# Patient Record
Sex: Female | Born: 1940 | Race: Black or African American | Hispanic: No | Marital: Married | Smoking: Never smoker
Health system: Southern US, Community
[De-identification: ages and names within clinical notes are randomized; demographics above are authoritative.]

## PROBLEM LIST (undated history)

## (undated) DIAGNOSIS — I2699 Other pulmonary embolism without acute cor pulmonale: Secondary | ICD-10-CM

## (undated) DIAGNOSIS — I10 Essential (primary) hypertension: Secondary | ICD-10-CM

## (undated) DIAGNOSIS — K219 Gastro-esophageal reflux disease without esophagitis: Secondary | ICD-10-CM

## (undated) DIAGNOSIS — I82409 Acute embolism and thrombosis of unspecified deep veins of unspecified lower extremity: Secondary | ICD-10-CM

## (undated) HISTORY — PX: ABDOMINAL HYSTERECTOMY: SHX81

## (undated) HISTORY — PX: KNEE ARTHROSCOPY: SUR90

## (undated) HISTORY — PX: KNEE SURGERY: SHX244

## (undated) HISTORY — PX: CHOLECYSTECTOMY: SHX55

---

## 2004-03-03 ENCOUNTER — Ambulatory Visit: Payer: Self-pay | Admitting: Internal Medicine

## 2004-07-14 ENCOUNTER — Ambulatory Visit: Payer: Self-pay | Admitting: Internal Medicine

## 2004-10-14 ENCOUNTER — Ambulatory Visit: Payer: Self-pay | Admitting: Internal Medicine

## 2004-10-15 ENCOUNTER — Emergency Department: Payer: Self-pay | Admitting: Emergency Medicine

## 2005-07-20 ENCOUNTER — Ambulatory Visit: Payer: Self-pay | Admitting: Internal Medicine

## 2006-02-08 ENCOUNTER — Ambulatory Visit: Payer: Self-pay | Admitting: Internal Medicine

## 2006-06-22 ENCOUNTER — Ambulatory Visit: Payer: Self-pay | Admitting: Gastroenterology

## 2006-07-26 ENCOUNTER — Ambulatory Visit: Payer: Self-pay | Admitting: Internal Medicine

## 2006-08-17 ENCOUNTER — Ambulatory Visit: Payer: Self-pay | Admitting: Orthopedic Surgery

## 2006-12-08 ENCOUNTER — Ambulatory Visit: Payer: Self-pay

## 2006-12-27 ENCOUNTER — Inpatient Hospital Stay (HOSPITAL_COMMUNITY): Admission: RE | Admit: 2006-12-27 | Discharge: 2006-12-31 | Payer: Self-pay | Admitting: Orthopedic Surgery

## 2006-12-30 ENCOUNTER — Ambulatory Visit: Payer: Self-pay | Admitting: Physical Medicine & Rehabilitation

## 2007-08-01 ENCOUNTER — Ambulatory Visit: Payer: Self-pay | Admitting: Internal Medicine

## 2007-10-03 ENCOUNTER — Ambulatory Visit: Payer: Self-pay

## 2007-10-05 ENCOUNTER — Inpatient Hospital Stay (HOSPITAL_COMMUNITY): Admission: RE | Admit: 2007-10-05 | Discharge: 2007-10-09 | Payer: Self-pay | Admitting: Orthopedic Surgery

## 2008-06-12 ENCOUNTER — Ambulatory Visit: Payer: Self-pay | Admitting: Urology

## 2008-07-10 ENCOUNTER — Ambulatory Visit: Payer: Self-pay | Admitting: Urology

## 2008-07-24 ENCOUNTER — Ambulatory Visit: Payer: Self-pay | Admitting: Urology

## 2008-08-23 ENCOUNTER — Ambulatory Visit: Payer: Self-pay | Admitting: Internal Medicine

## 2009-02-15 ENCOUNTER — Ambulatory Visit: Payer: Self-pay | Admitting: Urology

## 2009-09-09 ENCOUNTER — Ambulatory Visit: Payer: Self-pay | Admitting: Internal Medicine

## 2009-09-11 ENCOUNTER — Ambulatory Visit: Payer: Self-pay | Admitting: Internal Medicine

## 2010-02-18 ENCOUNTER — Ambulatory Visit: Payer: Self-pay | Admitting: Internal Medicine

## 2010-09-12 ENCOUNTER — Ambulatory Visit: Payer: Self-pay | Admitting: Internal Medicine

## 2010-09-30 NOTE — Op Note (Signed)
[name] NO.:  [id number]   MEDICAL RECORD NO.:  [id number]          PATIENT TYPE:  INP   LOCATION:  5023                         FACILITY:  [facility]   PHYSICIAN:  Loreta Ave, M.D. 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  Now with evidence of significant varus valgus laxity and polyethylene  wear with reactive synovitis.  Retained screw and washer, tibial  tubercle.   POSTOPERATIVE DIAGNOSES:  Right knee, status post total knee replacement  with a DePuy prosthesis rotating platform done by a different physician.  Now with evidence of significant varus valgus laxity and polyethylene  wear with reactive synovitis.  Retained screw and washer, tibial  tubercle, with extensive destruction and wear of the tibial component  and lesser extent patellar component.  Soft tissue contracture of the  lateral retinaculum and anterolateral capsule.  Marked reactive  synovitis.   PROCEDURES:  Right knee exam under anesthesia.  Arthrotomy with removal  of tibial and patellar components.  Revision of tibial component to a  17.5-mm thick rotating polyethylene DePuy platform standard plus/large.  Revision of patellar component to a standard plus polyethylene  component.  Lateral retinacular release, anterolateral capsule release.  Extensive synovectomy.  Removal of screw and washer from tibial  tubercle.   SURGEON:  Loreta Ave, MD   ASSISTANT:  Genene Churn. Barry Dienes, [place], present throughout the entire case  necessary for timely completion of procedure.   ANESTHESIA:  General.   BLOOD LOSS:  Minimal.   SPECIMENS:  None.   CULTURES:  Clear joint fluid was sent for aerobic and anaerobic culture  as this was revision.   DRESSING:   Soft compressor with knee immobilizer.   TOURNIQUET TIME:  1 hour 15 minutes.   PROCEDURE:  The patient was brought to the operating room and placed on  the operating table in supine position.  After adequate anesthesia had  been obtained, the right knee was examined in almost 10 degrees  hyperextension.  A 15-20-degree varus valgus arc going more into valgus  and varus.  Flexion a little bit better at 110 degrees.  Pseudolaxity of  all collateral ligaments because of polyethylene wear.  Tourniquet  applied, prepped and draped in usual sterile fashion.  Exsanguinated  with elevation.  Esmarch tourniquet inflated to 350 mmHg.  I utilized a  portion of her previous incision excising the widened incision above.  Skin and subcutaneous tissue divided.  Medial arthrotomy going up into  the quad tendon removing some of the buried nonabsorbable sutures.  Knee  exposed.  Clear fluid sent for culture.  Extensive reactive synovitis  throughout.  A lot of small possible debris from polyethylene and one  large piece, which had broken off the lateral side.  Soft tissue was  freed up.  Patellar component was examined, and the polyethylene portion  removed.  I then extracted  the polyethylene portion from the tibial  insert.  Excess synovectomy, debridement throughout, release of all scar  tissue.  The metallic portion of the femoral and tibial component and  patellar component were all well fixed and did not need to be revised.  Copious irrigation with a pulse irrigating device.  After going through  a series of trials, I chose a 17.5-mm tibial component.  This was  inserted and the knee reduced.  This gave me full extension, full  flexion, good alignment, good stability, but I did need it to release a  little of the anterolateral capsule in order to get the knee balanced.  When that was complete, I was pleased with alignment and stability.  Patellar component was revised, but she had persistent  lateral  patellofemoral tracking from the lateral contracture.  An entire lateral  retinacular release from inside out was then performed, and once that  was complete, I was very pleased with patellofemoral alignment and  tracking.  Full extension, full flexion, nicely balanced knee.  Wound  thoroughly irrigated.  Hemovac placed through a separate stab wound and  brought out anterolaterally.  Arthrotomy closed with #1 Vicryl.  Skin  and subcutaneous tissue with Vicryl and staples.  Prior to closure,  however, I did expose the tibial tubercle, split the patellar tendon  right over the prominent screw and removed the screw and washer without  compromising any of the attachment of the patellar tendon.  Once the  screw had been taken, I proceeded with the closure.  Once the wound was  closed with staples, the knee injected with Marcaine.  Hemovac clamped.  Sterile compressive dressing applied.  Tourniquet completely removed.  Knee immobilizer applied.  Anesthesia reversed.  Brought to the recovery  room.  Tolerated the surgery well.  No complications.      Loreta Ave, M.D.  Electronically Signed     DFM/MEDQ  D:  10/05/2007  T:  10/06/2007  Job:  [id number]

## 2010-09-30 NOTE — Op Note (Signed)
NAMECELICIA, MINAHAN              ACCOUNT NO.:  [id number]   MEDICAL RECORD NO.:  [id number]          PATIENT TYPE:  INP   LOCATION:  5019                         FACILITY:  [facility]   PHYSICIAN:  Loreta Ave, M.D. DATE OF BIRTH:  [birth year]   DATE OF PROCEDURE:  12/27/2006  DATE OF DISCHARGE:                               OPERATIVE REPORT   PREOPERATIVE DIAGNOSIS:  End stage degenerative arthritis, left knee,  with valgus alignment and significant laxity from wear of lateral  femoral condyle and lateral tibial plateau.   POSTOPERATIVE DIAGNOSIS:  End stage degenerative arthritis, left knee,  with valgus alignment and significant laxity from wear of lateral  femoral condyle and lateral tibial plateau.   PROCEDURE:  Left total knee replacement, [employer]ker Triathlon prosthesis,  cemented peg posterior stabilized #4 femoral component, cemented #4  tibial component with 13 mm polyethylene posterior stabilized insert,  resurfacing patella with a 32 mm x 10 mm medial offset cemented peg  component.   SURGEON:  Loreta Ave, M.D.   ASSISTANT:  Genene Churn. Barry Dienes, P.A.-C., present throughout the entire case.   ANESTHESIA:  General.   BLOOD LOSS:  Minimal.   TOURNIQUET TIME:  1 hour and 20 minutes.   SPECIMENS:  None.   COMPLICATIONS:  None.   PROCEDURE:  Soft compressive.   DRAIN:  Hemovac x1.   DESCRIPTION OF PROCEDURE:  The patient was brought to the operating room  and after adequate anesthesia had been obtained, the left knee examined.  Marked pseudolaxity with more than 15 degrees varus/valgus arch even in  full extension.  Although significant valgus alignment with weight  bearing, she still corrects and there is not a lot of soft tissue  contracture laterally.  Tourniquet applied.  Prepped and draped in the  usual sterile fashion.  Exsanguinated with elevation Esmarch, tourniquet  inflated to 350 mmHg.  Anterior incision above the patella down to the  tibial tubercle.   Minimally invasive approach with a medial arthrotomy  up to the superomedial border of the patella and then vastus splitting.  Knee exposed.  Medial capsule release.  Marked hypertrophic synovitis  removed.  Remnants of menisci, cruciate ligaments, fatty tissue,  periarticular spurs removed.  Distal femur exposed.  Intramedullary  guide placed.  A distal cut removing 10 mm primarily off the medial side  as the lateral side was very deficient removing very little bone there.  This was set at 5 degrees of valgus.  Sized for a #4 component.  Jigs  put in place, definitive cuts made.  Nice fitting and alignment with a  #4 component which was placed along the epicondylar axis.  Attention  turned the tibia.  Extramedullary guide.  3 degrees posterior slope cut  removing very little just getting under the lateral defect.  Size #4  component.  Trials put in place, #4 on the femur, #4 on the tibia. With  a 13 mm insert and with the appropriate bony cuts that had been made, I  had full extension, full flexion, nicely balanced knee with good  stability in flexion and extension and good  mechanical axis.  The  patella was measured, posterior 10 mm removed, drilled and sized for a  32 mm component.  Excellent tracking with trial in place.  With all  trials in place, the tibia was marked for appropriate rotation and then  hand reamed.  All trials removed.  Copious irrigation with a pulse  irrigating device.  Cement prepared and placed on all components which  were firmly seated.  Polyethylene attached to the tibia.  Knee reduced.  Once cement hardened, the knee was reexamined.  Full extension, full  flexion, good alignment, good stability in flexion/extension and good  patellofemoral tracking.  Wound irrigated once again.  Hemovac placed  and brought out through a separate stab wound.  Arthrotomy closed with  #1 Vicryl, skin and subcutaneous tissue with Vicryl and staples.  The  knee injected with  Marcaine.  Hemovac clamped.  Sterile compressive  dressing applied.  Tourniquet deflated and removed.  Knee immobilizer  applied.  Anesthesia reversed.  Brought to the recovery room.  Tolerated  the surgery well with no complications.      Loreta Ave, M.D.  Electronically Signed     DFM/MEDQ  D:  12/27/2006  T:  12/28/2006  Job:  [id number]

## 2010-10-03 NOTE — Discharge Summary (Signed)
[name], [name] NO.:  [id number]   MEDICAL RECORD NO.:  [id number]          PATIENT TYPE:  INP   LOCATION:  5019                         FACILITY:  [facility]   PHYSICIAN:  Loreta Ave, M.D. DATE OF BIRTH:  72/03/1941   DATE OF ADMISSION:  12/27/2006  DATE OF DISCHARGE:  12/31/2006                               DISCHARGE SUMMARY   FINAL DIAGNOSES:  1. 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  2. Hypertension.  3. Chronic dysphagia.  4. 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   HISTORY OF PRESENT ILLNESS:  Patient is a 70 year old black female with  history of end-stage DJD, left knee, and chronic pain, who presented to  our office for preoperative evaluation for total knee replacement.  She  had progressively worsening pain with failed response to conservative  treatment.  Significant decrease in her daily activities, due to the  ongoing complaint.   HOSPITAL COURSE:  On 11 August, 2008, patient was taken to the [facility] OR and a left total knee replacement procedure performed.  Surgeon  Mckinley Jewel, M.D. and assistant Zonia Kief, P.A.-C.  Anesthesia  general.  There were no specimens.  Estimated blood loss minimal.  Tourniquet time one hour and 28 minutes.  One Hemovac drain placed.  There were no surgical or anesthesia complications and the patient was  transferred to recovery in stable condition.  The evening of her  surgery, patient was started back on pharmacy protocol Coumadin.   On 28 December 2006,  patient doing well with good pain control.  No  specific complaints.  Temperature 100, pulse 96, respirations 20, blood  pressure 116/61.  Hematocrit 33.4, hemoglobin 11.6, sodium 137,  potassium 3.7, chloride 104, CO2 28, BUN 8, creatinine 0.77, glucose  127, INR 1.2.  Dressing clean, dry and intact.  Calf nontender,  neurovascularly intact.   On 29 December 1006, patient doing well with good pain control.  No  complaints of chest pain or shortness of breath.  Temperature 98.8,  pulse 90, respirations 20, blood pressure 109/62.  Hemoglobin 10.9,  hematocrit 31.5, electrolytes stable, INR 1.3.  Wound looks good,  staples intact.  No drainage or signs of infection.  Mild left-calf  tenderness.  Negative Homan's.  Neurovascularly intact.  Discontinued  Hemovac drain.  Discontinued PCA, Foley and O2.  Saline locked IV.   On 30 December 2006, patient doing okay with good pain control.  No  complaints of chest pain or shortness of breath.  Ambulation in room  only.  Vital signs stable, afebrile.  Hemoglobin 11.6, hematocrit 33.6,  electrolytes stable, INR 1.2.  Wound looks good, staples intact.  No  drainage or signs of infection.  Mild left-calf tenderness.  Negative  Homan's.  Neurovascularly intact.  Patient slow movement with therapy.   On 31 December 2006, patient doing well with good pain control.  Thinks  that she is wanting to go home.  She did well with therapy.  Temperature  98.8, pulse  90, respirations 18, blood pressure 110/64.  WBC 9.6,  hematocrit 34.3, hemoglobin 11.6, platelets 208, sodium 135, potassium  4.1, chloride 102, CO2 24, BUN 20, creatinine 0.73, glucose 125, INR  1.2.  Wound looks good, staples intact.  No drainage or signs of  infection.  Mild calf tenderness.  Negative Homan's.  Neurovascularly  intact.   DISPOSITION:  Discharged home.   CONDITION:  Good and stable.   MEDICATIONS:  1. Percocet 5/325 one or two tabs p.o. q. 4-6 hours p.r.n. for pain.  2. Lovenox 40 mg one subcu injection daily times three days and      discontinue if INR therapeutic, 2-3.  3. Coumadin pharmacy protocol.  Maintain INR 2-3.  4. Triamterene/HCTZ.  5. Omeprazole.   INSTRUCTIONS:  Patient will have home health PT and OT to improve her  range of motion, strength and ambulation.  Daily dressing changes with 4  X 4 gauze and tape.  She is  weightbearing as tolerated.  She will follow  up in the office in two weeks postoperatively for recheck and possible  staple removal.  Return sooner, if needed.      Genene Churn. Denton Meek.      Loreta Ave, M.D.  Electronically Signed    JMO/MEDQ  D:  02/02/2007  T:  02/02/2007  Job:  [id number]

## 2010-10-03 NOTE — Discharge Summary (Signed)
[name], RASCH NO.:  [id number]   MEDICAL RECORD NO.:  [id number]          PATIENT TYPE:  INP   LOCATION:  5023                         FACILITY:  [facility]   PHYSICIAN:  Loreta Ave, M.D. DATE OF BIRTH:  Dec 29, 1940   DATE OF ADMISSION:  10/05/2007  DATE OF DISCHARGE:  10/09/2007                               DISCHARGE SUMMARY   FINAL DIAGNOSES:  1. 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  2. Hypertension.  3. History of pulmonary embolism/deep venous thrombosis.   HISTORY OF PRESENT ILLNESS:  A 70 year old white female with history of  right total knee poly wear and pain, presented to our office for  followup evaluation for total knee revision.  She had a progressively  worsening pain and knee instability.  Failed conservative treatment.  Significantly decreased daily activities.   HOSPITAL COURSE:  On Oct 05, 2007, the patient was taken to the [facility] OR, and a right total knee revision and screw removal procedure was  performed.   SURGEON:  Loreta Ave, MD   ASSISTANT:  Genene Churn. Barry Dienes, PA-C   ANESTHESIA:  General.   SPECIMENS:  Synovial fluid culture sent to pathology.   ESTIMATED BLOOD LOSS:  Minimal.   TOURNIQUET TIME:  1 hour and 29 minutes.   DRAINS:  No drain used.   CONDITION ON DISCHARGE:  The patient was tolerated the procedure well  and was transferred to recovery room in stable condition.   HOSPITAL COURSE:  On Oct 05, 2007, the patient was doing well.  Good  pain control.  No complaints.  Temperature 101.8, pulse 98, respirations  18, and blood pressure 112/57.  Dressing clean, dry, and intact.  Calf  nontender and neurovascularly intact.  Discontinued PCA.  Restarted  Coumadin.  On Oct 07, 2007, the patient was doing well.  No complaints.  Vital signs stable and afebrile.  Synovial fluid culture, no growth.  Wound looked good and staples intact.  No drainage or signs of  infection.  Calf  nontender and neurovascularly intact.  Hemoglobin 11.7.  INR 1.3.  On Oct 08, 2007, the patient did well.  No complaints.  She is  progressing well.  On Oct 09, 2007, the patient doing well without  complaints.  Vital signs stable and afebrile.  INR 1.2.  Previous urine  culture, no growth.  Wound looked good and staples intact.  No drainage  or signs of infection.  The patient has done very well on therapy, and  she is ready for discharge home.   CONDITION:  Good and stable.   DISPOSITION:  Discharged home.   MEDICATIONS:  1. Norco 5/325 one-to-two tabs p.o. q.4-6 h. for pain.  2. Lovenox 30 mg one subcu injection b.i.d. x3 days, will stop when      Coumadin therapeutic.  3. Coumadin protocol.  Maintain INR 2-3.   INSTRUCTIONS:  The patient will work with home health PT and OT to  improve ambulation, knee range of motion, and strengthening.  Daily  dressing changes with 4x4 gauze and tape.  She will follow  up when she  is 2 weeks postop for a recheck.  Return sooner if needed.      Genene Churn. Denton Meek.      Loreta Ave, M.D.  Electronically Signed    JMO/MEDQ  D:  11/28/2007  T:  11/29/2007  Job:  [id number]

## 2011-02-11 LAB — URINALYSIS, ROUTINE W REFLEX MICROSCOPIC
Bilirubin Urine: NEGATIVE
Glucose, UA: NEGATIVE
Glucose, UA: NEGATIVE
Ketones, ur: NEGATIVE
Nitrite: POSITIVE — AB
Protein, ur: NEGATIVE
Specific Gravity, Urine: 1.018
Urobilinogen, UA: 0.2
pH: 5.5
pH: 7

## 2011-02-11 LAB — BASIC METABOLIC PANEL
CO2: 26
GFR calc non Af Amer: >60
Glucose, Bld: 131 — ABNORMAL HIGH
Potassium: 3.7
Sodium: 136

## 2011-02-11 LAB — APTT: aPTT: 28

## 2011-02-11 LAB — CBC
HCT: 35.3 — ABNORMAL LOW
HCT: 43
Hemoglobin: 11.2 — ABNORMAL LOW
Hemoglobin: 12.3
Hemoglobin: 14.6
MCHC: 33.8
MCHC: 34.8
MCV: 97.9
MCV: 98
Platelets: 148 — ABNORMAL LOW
Platelets: 158
Platelets: 212
RBC: 3.22 — ABNORMAL LOW
RBC: 3.6 — ABNORMAL LOW
RBC: 4.39
RDW: 12.8
RDW: 13
RDW: 13.1
WBC: 12.4 — ABNORMAL HIGH
WBC: 7.4
WBC: 8.5

## 2011-02-11 LAB — TYPE AND SCREEN
ABO/RH(D): AB POS
Antibody Screen: NEGATIVE

## 2011-02-11 LAB — BASIC METABOLIC PANEL WITH GFR
BUN: 11
BUN: 11
CO2: 25
CO2: 27
Calcium: 8.3 — ABNORMAL LOW
Calcium: 8.6
Chloride: 104
Chloride: 105
Creatinine, Ser: 0.7
Creatinine, Ser: 0.78
GFR calc non Af Amer: >60
GFR calc non Af Amer: >60
Glucose, Bld: 101 — ABNORMAL HIGH
Glucose, Bld: 139 — ABNORMAL HIGH
Potassium: 3.4 — ABNORMAL LOW
Potassium: 3.6
Sodium: 136
Sodium: 136

## 2011-02-11 LAB — BODY FLUID CULTURE: Culture: NO GROWTH

## 2011-02-11 LAB — URINE CULTURE: Special Requests: NEGATIVE

## 2011-02-11 LAB — URINE MICROSCOPIC-ADD ON

## 2011-02-11 LAB — PROTIME-INR
INR: 0.9
INR: 1.2
INR: 1.2
INR: 1.3
INR: 1.3
Prothrombin Time: 12.2
Prothrombin Time: 15.2
Prothrombin Time: 15.3 — ABNORMAL HIGH
Prothrombin Time: 16.2 — ABNORMAL HIGH
Prothrombin Time: 16.5 — ABNORMAL HIGH

## 2011-02-11 LAB — COMPREHENSIVE METABOLIC PANEL
AST: 25
Albumin: 3.7
Calcium: 9.6
Chloride: 103
Creatinine, Ser: 0.82
GFR calc Af Amer: >60
Total Bilirubin: 0.6

## 2011-02-11 LAB — ANAEROBIC CULTURE

## 2011-02-27 LAB — CBC
HCT: 34.3 — ABNORMAL LOW
Platelets: 208
RBC: 3.47 — ABNORMAL LOW
WBC: 9.6

## 2011-02-27 LAB — BASIC METABOLIC PANEL
BUN: 20
Creatinine, Ser: 0.73
GFR calc Af Amer: >60
GFR calc non Af Amer: >60
Potassium: 4.1

## 2011-02-27 LAB — PROTIME-INR: Prothrombin Time: 15.4 — ABNORMAL HIGH

## 2011-03-02 LAB — URINALYSIS, ROUTINE W REFLEX MICROSCOPIC
Ketones, ur: NEGATIVE
Nitrite: NEGATIVE
Specific Gravity, Urine: 1.018
pH: 6

## 2011-03-02 LAB — BASIC METABOLIC PANEL
BUN: 15
BUN: 9
CO2: 25
CO2: 25
CO2: 28
Calcium: 8.6
Chloride: 104
Chloride: 105
Chloride: 109
Creatinine, Ser: 0.77
Creatinine, Ser: 0.77
GFR calc Af Amer: >60
GFR calc non Af Amer: >60
Glucose, Bld: 115 — ABNORMAL HIGH
Glucose, Bld: 96
Glucose, Bld: 97
Potassium: 3.5
Potassium: 3.8
Sodium: 137

## 2011-03-02 LAB — CBC
HCT: 31.5 — ABNORMAL LOW
HCT: 33.4 — ABNORMAL LOW
Hemoglobin: 11.6 — ABNORMAL LOW
Hemoglobin: 11.6 — ABNORMAL LOW
MCHC: 33.6
MCHC: 34.4
MCHC: 34.6
MCV: 96.9
MCV: 97.5
MCV: 98.2
MCV: 98.4
Platelets: 165
Platelets: 203
RBC: 3.43 — ABNORMAL LOW
RDW: 12.3
RDW: 12.7

## 2011-03-02 LAB — TYPE AND SCREEN
ABO/RH(D): AB POS
Antibody Screen: NEGATIVE

## 2011-03-02 LAB — COMPREHENSIVE METABOLIC PANEL
AST: 26
Albumin: 3.5
Calcium: 9.6
Creatinine, Ser: 0.76
GFR calc Af Amer: >60
Total Protein: 7.3

## 2011-03-02 LAB — URINE MICROSCOPIC-ADD ON

## 2011-03-02 LAB — APTT: aPTT: 31

## 2011-03-02 LAB — PROTIME-INR: INR: 1.2

## 2011-08-11 ENCOUNTER — Ambulatory Visit: Payer: Self-pay | Admitting: Emergency Medicine

## 2011-08-11 DIAGNOSIS — I10 Essential (primary) hypertension: Secondary | ICD-10-CM

## 2011-08-11 LAB — CBC WITH DIFFERENTIAL/PLATELET
Basophil #: 0 10*3/uL (ref 0.0–0.1)
Eosinophil #: 0.3 10*3/uL (ref 0.0–0.7)
Eosinophil %: 4.4 %
HCT: 43.4 % (ref 35.0–47.0)
HGB: 14.7 g/dL (ref 12.0–16.0)
MCH: 33.4 pg (ref 26.0–34.0)
MCHC: 33.8 g/dL (ref 32.0–36.0)
Monocyte #: 0.7 10*3/uL (ref 0.0–0.7)
Neutrophil #: 3.3 10*3/uL (ref 1.4–6.5)
Neutrophil %: 51.9 %
Platelet: 180 10*3/uL (ref 150–440)
RDW: 12.8 % (ref 11.5–14.5)
WBC: 6.4 10*3/uL (ref 3.6–11.0)

## 2011-08-11 LAB — HEPATIC FUNCTION PANEL A (ARMC)
Bilirubin, Direct: <0.1 mg/dL (ref 0.00–0.20)
Bilirubin,Total: 0.3 mg/dL (ref 0.2–1.0)
SGPT (ALT): 23 U/L

## 2011-08-11 LAB — PROTIME-INR: Prothrombin Time: 22.6 secs — ABNORMAL HIGH (ref 11.5–14.7)

## 2011-08-18 ENCOUNTER — Ambulatory Visit: Payer: Self-pay | Admitting: Emergency Medicine

## 2011-08-18 LAB — PROTIME-INR: INR: 1

## 2011-08-20 LAB — PATHOLOGY REPORT

## 2011-11-10 ENCOUNTER — Ambulatory Visit: Payer: Self-pay | Admitting: Internal Medicine

## 2011-12-28 ENCOUNTER — Ambulatory Visit: Payer: Self-pay | Admitting: Urology

## 2013-01-03 ENCOUNTER — Ambulatory Visit: Payer: Self-pay | Admitting: Internal Medicine

## 2013-01-26 ENCOUNTER — Ambulatory Visit: Payer: Self-pay | Admitting: Urology

## 2013-06-21 ENCOUNTER — Ambulatory Visit: Payer: Self-pay | Admitting: Gastroenterology

## 2013-06-21 LAB — CREATININE, SERUM
CREATININE: 1.07 mg/dL (ref 0.60–1.30)
EGFR (African American): 59 — ABNORMAL LOW
GFR CALC NON AF AMER: 51 — AB

## 2013-07-06 ENCOUNTER — Ambulatory Visit: Payer: Self-pay | Admitting: Gastroenterology

## 2013-08-10 IMAGING — CR DG CHOLANGIOGRAM OPERATIVE
1 series · 10 of 10 positions shown · non-contrast
Comparison: none

REASON FOR EXAM: cholelithiasis
COMMENTS:

[Series 5: cont. · 10 of 34 frames shown]
[frame 1/34]
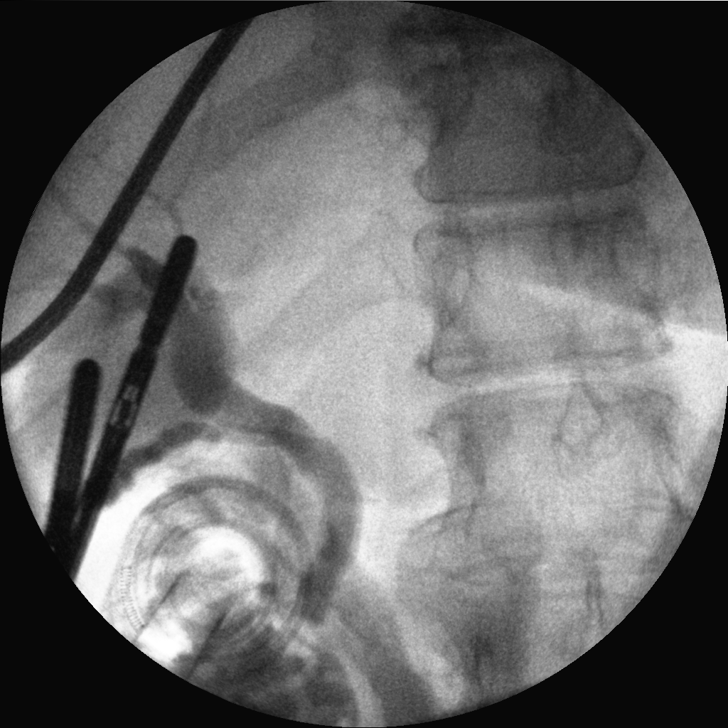
[frame 4/34]
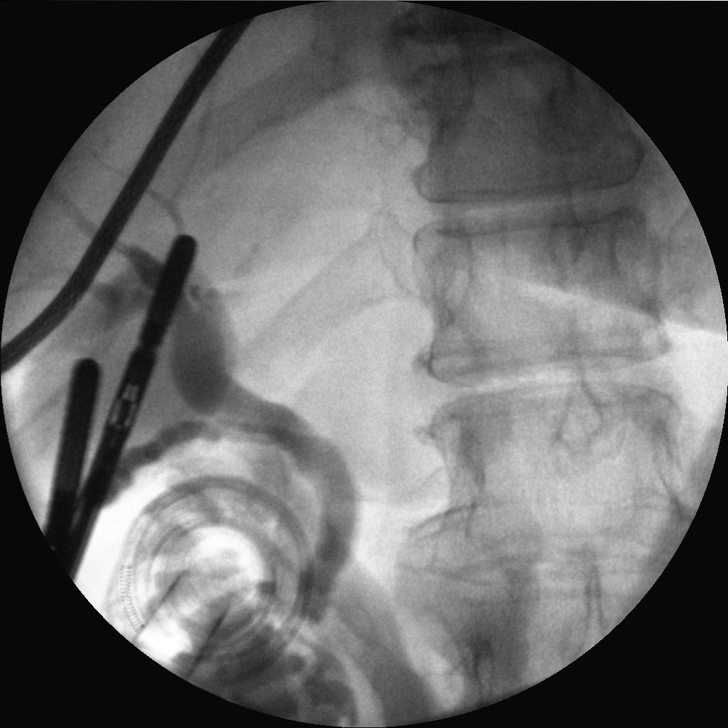
[frame 8/34]
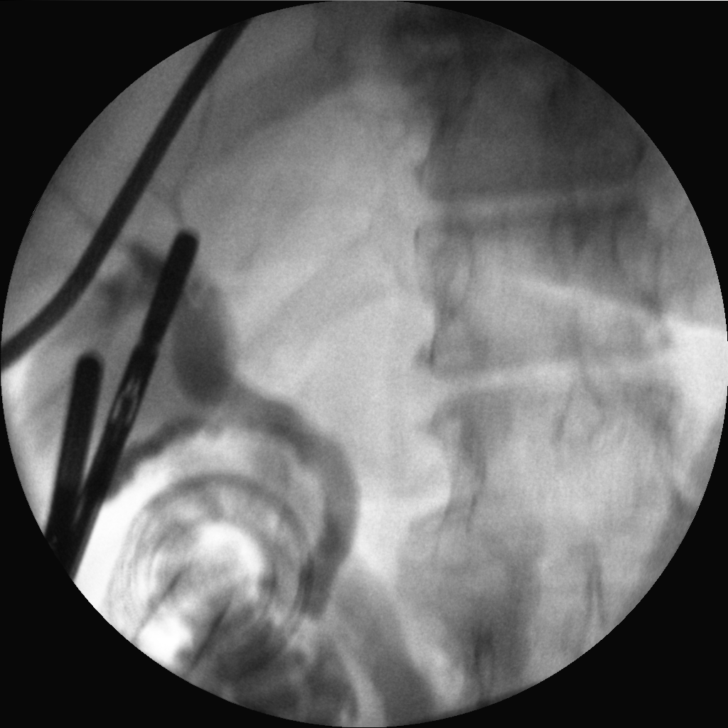
[frame 12/34]
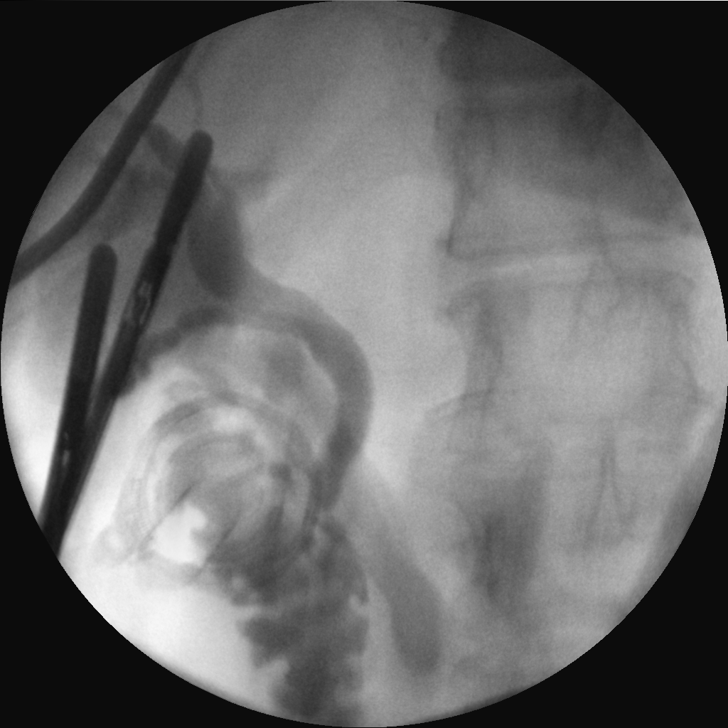
[frame 15/34]
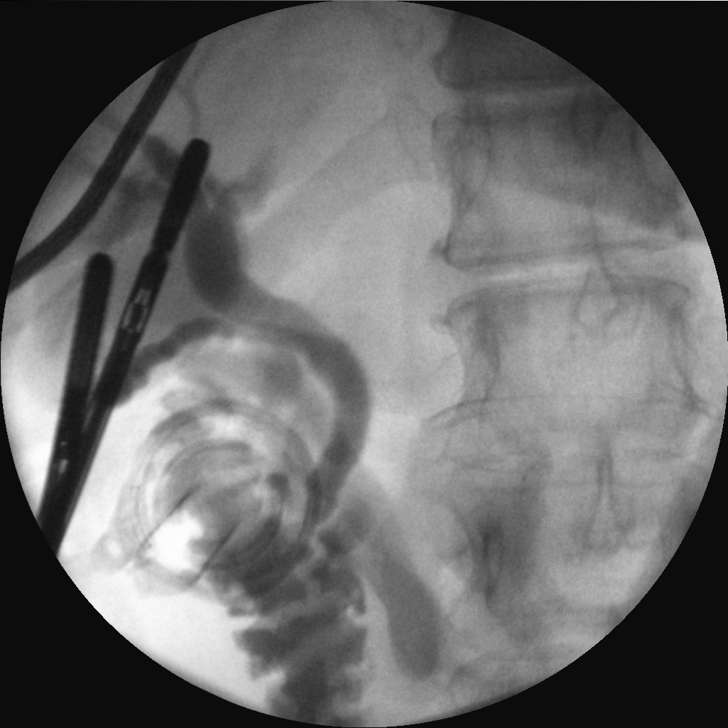
[frame 19/34]
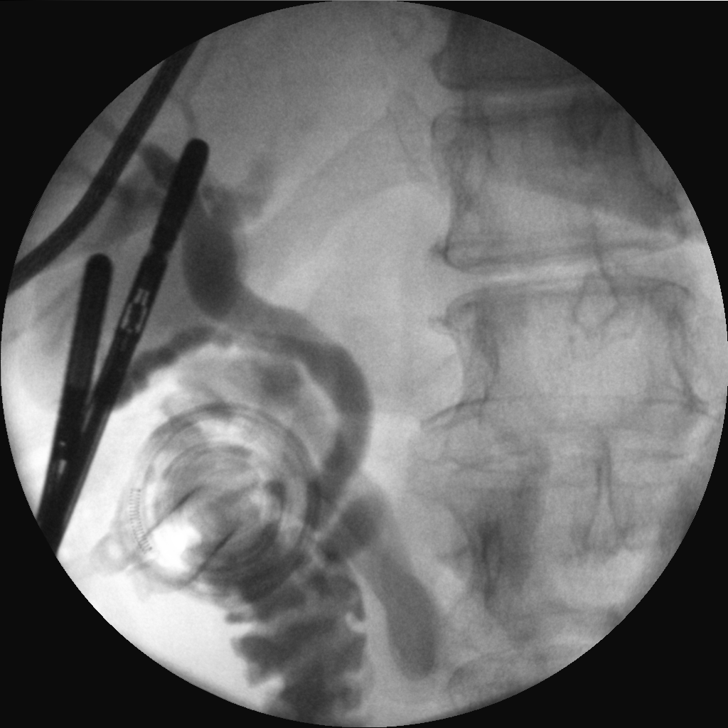
[frame 23/34]
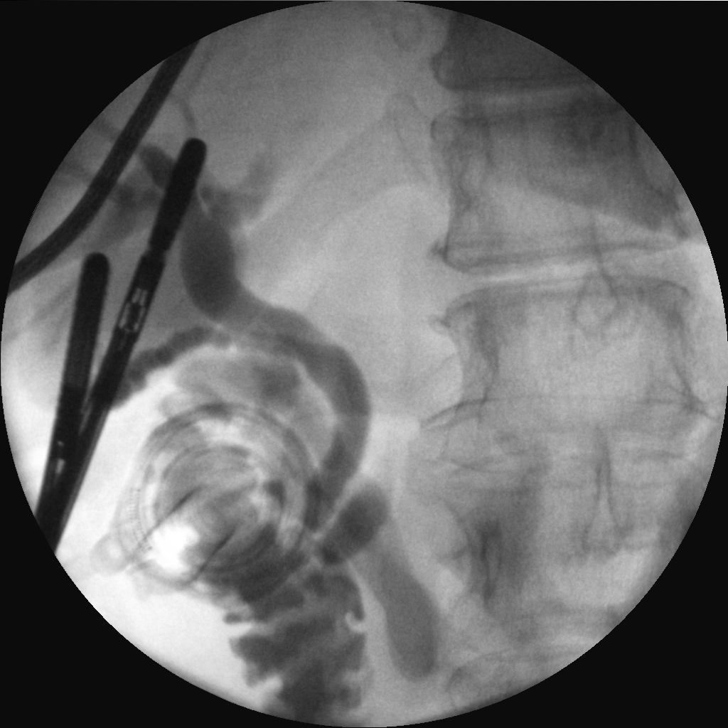
[frame 26/34]
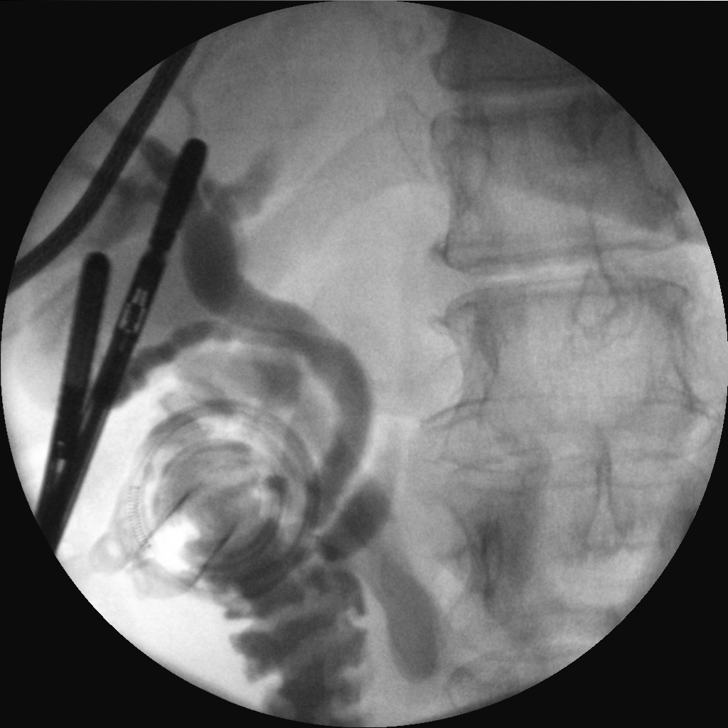
[frame 30/34]
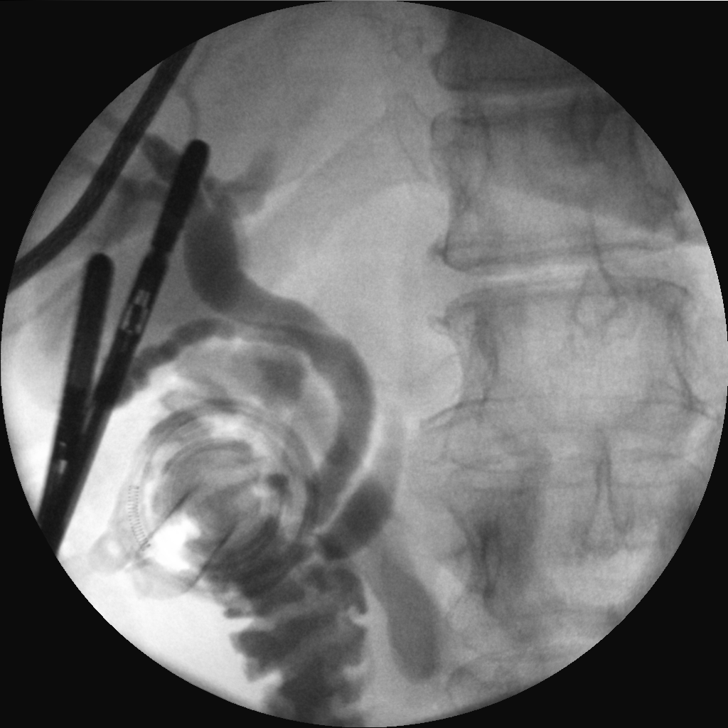
[frame 34/34]
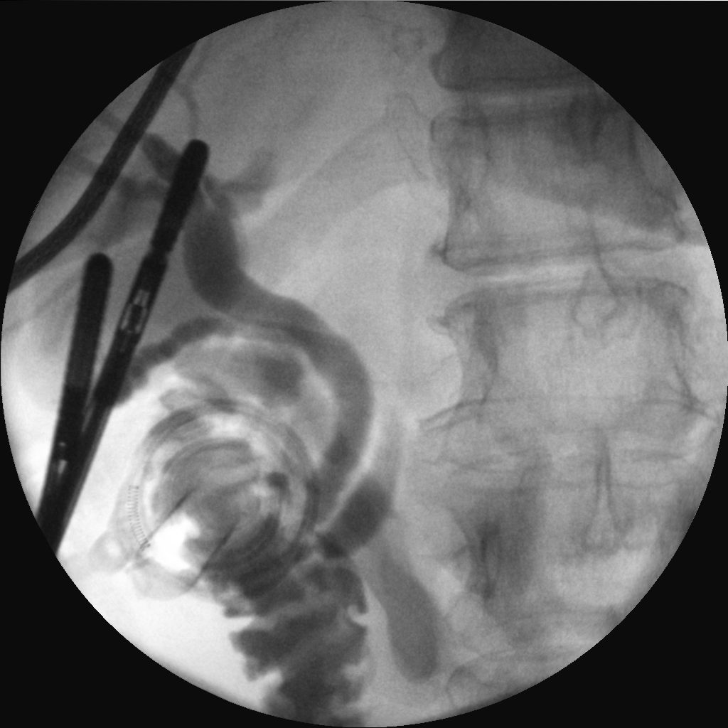

[10 of 10 positions shown; findings below may reference images not displayed]

PROCEDURE:     DXR - DXR CHOLANGIOGRAM OP (INITIAL)  - August 18, 2011  [DATE]

RESULT:     Images from an laparoscopic cholangiographic procedure
demonstrate contrast flowing from the common bile duct into the small bowel
area there is dilation of the common duct suggested. Portions of the cystic
duct are seen. There appears to be reflux into the pancreatic duct which
appears to be distended. Definite filling defect is not seen.
IMPRESSION: Please see above.

## 2014-01-04 ENCOUNTER — Ambulatory Visit: Payer: Medicare Other | Admitting: Internal Medicine

## 2014-09-09 NOTE — Op Note (Signed)
PATIENT NAME:  [name], [name] MR#:  [id number] DATE OF BIRTH:  1941/03/23  DATE OF PROCEDURE:  08/18/2011  PREOPERATIVE DIAGNOSIS: Acute cholecystitis.   POSTOPERATIVE DIAGNOSIS: Acute cholecystitis.   PROCEDURE PERFORMED: Laparoscopic cholecystectomy with cholangiogram.   SURGEON:  Dawid Dupriest S. Kienna Moncada, MD.   ANESTHESIA:  General.   INDICATION: This patient who was having right upper quadrant abdominal pain was found to have gallstones on ultrasound and was scheduled for laparoscopic cholecystectomy. She was on Coumadin which was stopped. She was put on Lovenox and now, today, she was brought in for surgery and her INR was normal at this time.   DESCRIPTION OF PROCEDURE: The patient was brought to surgery. Under general anesthesia, the abdomen was then prepped and draped. A small incision was made above the umbilicus. Cutting skin and subcutaneous tissue, the fascia was cut and held with a stitch on both sides and trocar was put in under direct vision. After that, air was insufflated. The patient had lot adhesions at the lower part of umbilicus. They were avoided and in the epigastric region, another 10 mm was put in. Two 5 mm were put in the right upper quadrant. The gallbladder was quite large. It was then taken up, grasped with grasper and the dissection was done over the cystic duct-gallbladder junction. Cystic artery was small and was tucked next to the cystic duct. The cholangiogram first of all was taken and was found the patient had a large cystic duct and the common bile duct was large, but there was no stones in the common duct. After the cholangiogram was done, the cystic duct was then clipped twice and cut and gallbladder was then lifted off from the liver bed without much bleeding and taken off from the umbilical area. The gallbladder looked like it was inflamed.   After the liver surface was visualized for any bleeding which we did not see and all the trocars were removed under direct  vision. The umbilical port was closed with interrupted 0 Vicryl sutures. Subcuticular sutures, 4-0, were applied on the subcutaneous tissue and Steri-Strips applied. The patient tolerated the procedure well and was sent to the recovery room in satisfactory condition.   At the time of discharge, the patient will be given another Lovenox 40 mg subcutaneous and she will be continued on Lovenox for three days and then Coumadin will be started today also. She has extreme risk of DVTs, so that is why we are doing that. The patient did very well after surgery.  ____________________________ Alton RevereMasud S. Cecelia ByarsHashmi, MD msh:ap D: 08/18/2011 09:51:23 ET T: 08/18/2011 11:01:37 ET JOB#: [id number]  cc: Elder Davidian S. Cecelia ByarsHashmi, MD, <Dictator> Marlyn CorporalFayegh H. Jadali, MD Meryle ReadyMASUD S Naimah Yingst MD ELECTRONICALLY SIGNED 08/20/2011 11:54

## 2015-01-14 ENCOUNTER — Other Ambulatory Visit: Payer: Self-pay | Admitting: Internal Medicine

## 2015-01-14 DIAGNOSIS — Z1231 Encounter for screening mammogram for malignant neoplasm of breast: Secondary | ICD-10-CM

## 2015-01-30 ENCOUNTER — Ambulatory Visit: Payer: Self-pay

## 2015-02-06 ENCOUNTER — Other Ambulatory Visit: Payer: Self-pay | Admitting: Internal Medicine

## 2015-02-06 ENCOUNTER — Ambulatory Visit
Admission: RE | Admit: 2015-02-06 | Discharge: 2015-02-06 | Disposition: A | Discharge status: Home / Self Care | Payer: Medicare HMO | Source: Ambulatory Visit | Attending: Internal Medicine | Admitting: Internal Medicine

## 2015-02-06 DIAGNOSIS — Z1231 Encounter for screening mammogram for malignant neoplasm of breast: Secondary | ICD-10-CM

## 2015-04-22 ENCOUNTER — Other Ambulatory Visit: Payer: Self-pay | Admitting: Internal Medicine

## 2015-04-22 DIAGNOSIS — R109 Unspecified abdominal pain: Secondary | ICD-10-CM

## 2015-04-29 ENCOUNTER — Ambulatory Visit
Admission: RE | Admit: 2015-04-29 | Discharge: 2015-04-29 | Disposition: A | Discharge status: Home / Self Care | Payer: Medicare HMO | Source: Ambulatory Visit | Attending: Internal Medicine | Admitting: Internal Medicine

## 2015-04-29 DIAGNOSIS — R109 Unspecified abdominal pain: Secondary | ICD-10-CM

## 2015-04-29 DIAGNOSIS — N281 Cyst of kidney, acquired: Secondary | ICD-10-CM | POA: Insufficient documentation

## 2015-04-29 DIAGNOSIS — Z9049 Acquired absence of other specified parts of digestive tract: Secondary | ICD-10-CM | POA: Diagnosis not present

## 2015-05-07 ENCOUNTER — Ambulatory Visit: Payer: Self-pay | Admitting: Urology

## 2015-06-13 ENCOUNTER — Emergency Department
Admission: EM | Admit: 2015-06-13 | Discharge: 2015-06-13 | Disposition: A | Discharge status: Home / Self Care | Payer: Medicare HMO | Attending: Student | Admitting: Student

## 2015-06-13 ENCOUNTER — Emergency Department: Payer: Medicare HMO

## 2015-06-13 ENCOUNTER — Ambulatory Visit
Admission: EM | Admit: 2015-06-13 | Discharge: 2015-06-13 | Disposition: A | Discharge status: Other Acute Inpatient Hospital | Payer: Medicare HMO | Attending: Family Medicine | Admitting: Family Medicine

## 2015-06-13 ENCOUNTER — Encounter: Payer: Self-pay | Admitting: *Deleted

## 2015-06-13 DIAGNOSIS — R079 Chest pain, unspecified: Secondary | ICD-10-CM | POA: Insufficient documentation

## 2015-06-13 DIAGNOSIS — M6248 Contracture of muscle, other site: Secondary | ICD-10-CM | POA: Diagnosis not present

## 2015-06-13 DIAGNOSIS — N390 Urinary tract infection, site not specified: Secondary | ICD-10-CM | POA: Insufficient documentation

## 2015-06-13 DIAGNOSIS — Z86711 Personal history of pulmonary embolism: Secondary | ICD-10-CM | POA: Insufficient documentation

## 2015-06-13 DIAGNOSIS — R0602 Shortness of breath: Secondary | ICD-10-CM

## 2015-06-13 DIAGNOSIS — Y9389 Activity, other specified: Secondary | ICD-10-CM | POA: Diagnosis not present

## 2015-06-13 DIAGNOSIS — M542 Cervicalgia: Secondary | ICD-10-CM

## 2015-06-13 DIAGNOSIS — X58XXXA Exposure to other specified factors, initial encounter: Secondary | ICD-10-CM | POA: Insufficient documentation

## 2015-06-13 DIAGNOSIS — Z79899 Other long term (current) drug therapy: Secondary | ICD-10-CM | POA: Insufficient documentation

## 2015-06-13 DIAGNOSIS — M62838 Other muscle spasm: Secondary | ICD-10-CM | POA: Insufficient documentation

## 2015-06-13 DIAGNOSIS — K219 Gastro-esophageal reflux disease without esophagitis: Secondary | ICD-10-CM | POA: Insufficient documentation

## 2015-06-13 DIAGNOSIS — Z7901 Long term (current) use of anticoagulants: Secondary | ICD-10-CM | POA: Diagnosis not present

## 2015-06-13 DIAGNOSIS — Y998 Other external cause status: Secondary | ICD-10-CM | POA: Insufficient documentation

## 2015-06-13 DIAGNOSIS — I10 Essential (primary) hypertension: Secondary | ICD-10-CM | POA: Diagnosis not present

## 2015-06-13 DIAGNOSIS — Y9289 Other specified places as the place of occurrence of the external cause: Secondary | ICD-10-CM | POA: Insufficient documentation

## 2015-06-13 DIAGNOSIS — R0789 Other chest pain: Secondary | ICD-10-CM | POA: Diagnosis not present

## 2015-06-13 DIAGNOSIS — S46812A Strain of other muscles, fascia and tendons at shoulder and upper arm level, left arm, initial encounter: Secondary | ICD-10-CM

## 2015-06-13 DIAGNOSIS — S29012A Strain of muscle and tendon of back wall of thorax, initial encounter: Secondary | ICD-10-CM | POA: Insufficient documentation

## 2015-06-13 HISTORY — DX: Other pulmonary embolism without acute cor pulmonale: I26.99

## 2015-06-13 HISTORY — DX: Essential (primary) hypertension: I10

## 2015-06-13 HISTORY — DX: Acute embolism and thrombosis of unspecified deep veins of unspecified lower extremity: I82.409

## 2015-06-13 HISTORY — DX: Gastro-esophageal reflux disease without esophagitis: K21.9

## 2015-06-13 LAB — CBC
HEMATOCRIT: 41.6 % (ref 35.0–47.0)
HEMOGLOBIN: 13.8 g/dL (ref 12.0–16.0)
MCH: 32.7 pg (ref 26.0–34.0)
MCHC: 33.2 g/dL (ref 32.0–36.0)
MCV: 98.6 fL (ref 80.0–100.0)
Platelets: 180 10*3/uL (ref 150–440)
RBC: 4.22 MIL/uL (ref 3.80–5.20)
RDW: 12.3 % (ref 11.5–14.5)
WBC: 10.9 10*3/uL (ref 3.6–11.0)

## 2015-06-13 LAB — URINALYSIS COMPLETE WITH MICROSCOPIC (ARMC ONLY)
BILIRUBIN URINE: NEGATIVE
GLUCOSE, UA: NEGATIVE mg/dL
KETONES UR: NEGATIVE mg/dL
LEUKOCYTES UA: NEGATIVE
NITRITE: NEGATIVE
Protein, ur: NEGATIVE mg/dL
SPECIFIC GRAVITY, URINE: 1.01 (ref 1.005–1.030)
pH: 6 (ref 5.0–8.0)

## 2015-06-13 LAB — BASIC METABOLIC PANEL
ANION GAP: 9 (ref 5–15)
BUN: 17 mg/dL (ref 6–20)
CALCIUM: 9.2 mg/dL (ref 8.9–10.3)
CO2: 23 mmol/L (ref 22–32)
Chloride: 102 mmol/L (ref 101–111)
Creatinine, Ser: 0.81 mg/dL (ref 0.44–1.00)
Glucose, Bld: 99 mg/dL (ref 65–99)
POTASSIUM: 3.6 mmol/L (ref 3.5–5.1)
Sodium: 134 mmol/L — ABNORMAL LOW (ref 135–145)

## 2015-06-13 LAB — PROTIME-INR
INR: 2.05
PROTHROMBIN TIME: 23 s — AB (ref 11.4–15.0)

## 2015-06-13 LAB — TROPONIN I

## 2015-06-13 MED ORDER — DIAZEPAM 2 MG PO TABS
2.0000 mg | ORAL_TABLET | Freq: Once | ORAL | Status: AC
  Administered 2015-06-13: 2 mg via ORAL
  Filled 2015-06-13: qty 1

## 2015-06-13 MED ORDER — OXYCODONE-ACETAMINOPHEN 5-325 MG PO TABS
1.0000 | ORAL_TABLET | Freq: Once | ORAL | Status: DC
  Filled 2015-06-13: qty 1

## 2015-06-13 MED ORDER — NITROFURANTOIN MONOHYD MACRO 100 MG PO CAPS
100.0000 mg | ORAL_CAPSULE | Freq: Once | ORAL | Status: AC
  Administered 2015-06-13: 100 mg via ORAL
  Filled 2015-06-13: qty 1

## 2015-06-13 MED ORDER — NITROFURANTOIN MONOHYD MACRO 100 MG PO CAPS: 100.0000 mg | ORAL_CAPSULE | Freq: Two times a day (BID) | ORAL | Status: AC

## 2015-06-13 MED ORDER — DIAZEPAM 2 MG PO TABS: 2.0000 mg | ORAL_TABLET | Freq: Three times a day (TID) | ORAL | Status: DC | PRN

## 2015-06-13 NOTE — ED Notes (Addendum)
Pt c/o pain radiating from the left side of neck around the back of her shoulders and into the left arm since Monday, c/o generalized weakness with intermittent SOB.Marland Kitchenpt also c/o having dark/brown colored urine twice since Monday.

## 2015-06-13 NOTE — Discharge Instructions (Signed)
Cervical Strain and Sprain With Rehab  Cervical strain and sprain are injuries that commonly occur with "whiplash" injuries. Whiplash occurs when the neck is forcefully whipped backward or forward, such as during a motor vehicle accident or during contact sports. The muscles, ligaments, tendons, discs, and nerves of the neck are susceptible to injury when this occurs.  RISK FACTORS  Risk of having a whiplash injury increases if:  · Osteoarthritis of the spine.  · Situations that make head or neck accidents or trauma more likely.  · High-risk sports (football, rugby, wrestling, hockey, auto racing, gymnastics, diving, contact karate, or boxing).  · Poor strength and flexibility of the neck.  · Previous neck injury.  · Poor tackling technique.  · Improperly fitted or padded equipment.  SYMPTOMS   · Pain or stiffness in the front or back of neck or both.  · Symptoms may present immediately or up to 24 hours after injury.  · Dizziness, headache, nausea, and vomiting.  · Muscle spasm with soreness and stiffness in the neck.  · Tenderness and swelling at the injury site.  PREVENTION  · Learn and use proper technique (avoid tackling with the head, spearing, and head-butting; use proper falling techniques to avoid landing on the head).  · Warm up and stretch properly before activity.  · Maintain physical fitness:    Strength, flexibility, and endurance.    Cardiovascular fitness.  · Wear properly fitted and padded protective equipment, such as padded soft collars, for participation in contact sports.  PROGNOSIS   Recovery from cervical strain and sprain injuries is dependent on the extent of the injury. These injuries are usually curable in 1 week to 3 months with appropriate treatment.   RELATED COMPLICATIONS   · Temporary numbness and weakness may occur if the nerve roots are damaged, and this may persist until the nerve has completely healed.  · Chronic pain due to frequent recurrence of symptoms.  · Prolonged healing,  especially if activity is resumed too soon (before complete recovery).  TREATMENT   Treatment initially involves the use of ice and medication to help reduce pain and inflammation. It is also important to perform strengthening and stretching exercises and modify activities that worsen symptoms so the injury does not get worse. These exercises may be performed at home or with a therapist. For patients who experience severe symptoms, a soft, padded collar may be recommended to be worn around the neck.   Improving your posture may help reduce symptoms. Posture improvement includes pulling your chin and abdomen in while sitting or standing. If you are sitting, sit in a firm chair with your buttocks against the back of the chair. While sleeping, try replacing your pillow with a small towel rolled to 2 inches in diameter, or use a cervical pillow or soft cervical collar. Poor sleeping positions delay healing.   For patients with nerve root damage, which causes numbness or weakness, the use of a cervical traction apparatus may be recommended. Surgery is rarely necessary for these injuries. However, cervical strain and sprains that are present at birth (congenital) may require surgery.  MEDICATION   · If pain medication is necessary, nonsteroidal anti-inflammatory medications, such as aspirin and ibuprofen, or other minor pain relievers, such as acetaminophen, are often recommended.  · Do not take pain medication for 7 days before surgery.  · Prescription pain relievers may be given if deemed necessary by your caregiver. Use only as directed and only as much as you need.    HEAT AND COLD:   · Cold treatment (icing) relieves pain and reduces inflammation. Cold treatment should be applied for 10 to 15 minutes every 2 to 3 hours for inflammation and pain and immediately after any activity that aggravates your symptoms. Use ice packs or an ice massage.  · Heat treatment may be used prior to performing the stretching and  strengthening activities prescribed by your caregiver, physical therapist, or athletic trainer. Use a heat pack or a warm soak.  SEEK MEDICAL CARE IF:   · Symptoms get worse or do not improve in 2 weeks despite treatment.  · New, unexplained symptoms develop (drugs used in treatment may produce side effects).  EXERCISES  RANGE OF MOTION (ROM) AND STRETCHING EXERCISES - Cervical Strain and Sprain  These exercises may help you when beginning to rehabilitate your injury. In order to successfully resolve your symptoms, you must improve your posture. These exercises are designed to help reduce the forward-head and rounded-shoulder posture which contributes to this condition. Your symptoms may resolve with or without further involvement from your physician, physical therapist or athletic trainer. While completing these exercises, remember:   · Restoring tissue flexibility helps normal motion to return to the joints. This allows healthier, less painful movement and activity.  · An effective stretch should be held for at least 20 seconds, although you may need to begin with shorter hold times for comfort.  · A stretch should never be painful. You should only feel a gentle lengthening or release in the stretched tissue.  STRETCH- Axial Extensors  · Lie on your back on the floor. You may bend your knees for comfort. Place a rolled-up hand towel or dish towel, about 2 inches in diameter, under the part of your head that makes contact with the floor.  · Gently tuck your chin, as if trying to make a "double chin," until you feel a gentle stretch at the base of your head.  · Hold __________ seconds.  Repeat __________ times. Complete this exercise __________ times per day.   STRETCH - Axial Extension   · Stand or sit on a firm surface. Assume a good posture: chest up, shoulders drawn back, abdominal muscles slightly tense, knees unlocked (if standing) and feet hip width apart.  · Slowly retract your chin so your head slides back  and your chin slightly lowers. Continue to look straight ahead.  · You should feel a gentle stretch in the back of your head. Be certain not to feel an aggressive stretch since this can cause headaches later.  · Hold for __________ seconds.  Repeat __________ times. Complete this exercise __________ times per day.  STRETCH - Cervical Side Bend   · Stand or sit on a firm surface. Assume a good posture: chest up, shoulders drawn back, abdominal muscles slightly tense, knees unlocked (if standing) and feet hip width apart.  · Without letting your nose or shoulders move, slowly tip your right / left ear to your shoulder until your feel a gentle stretch in the muscles on the opposite side of your neck.  · Hold __________ seconds.  Repeat __________ times. Complete this exercise __________ times per day.  STRETCH - Cervical Rotators   · Stand or sit on a firm surface. Assume a good posture: chest up, shoulders drawn back, abdominal muscles slightly tense, knees unlocked (if standing) and feet hip width apart.  · Keeping your eyes level with the ground, slowly turn your head until you feel a gentle stretch along   the back and opposite side of your neck.  · Hold __________ seconds.  Repeat __________ times. Complete this exercise __________ times per day.  RANGE OF MOTION - Neck Circles   · Stand or sit on a firm surface. Assume a good posture: chest up, shoulders drawn back, abdominal muscles slightly tense, knees unlocked (if standing) and feet hip width apart.  · Gently roll your head down and around from the back of one shoulder to the back of the other. The motion should never be forced or painful.  · Repeat the motion 10-20 times, or until you feel the neck muscles relax and loosen.  Repeat __________ times. Complete the exercise __________ times per day.  STRENGTHENING EXERCISES - Cervical Strain and Sprain  These exercises may help you when beginning to rehabilitate your injury. They may resolve your symptoms with or  without further involvement from your physician, physical therapist, or athletic trainer. While completing these exercises, remember:   · Muscles can gain both the endurance and the strength needed for everyday activities through controlled exercises.  · Complete these exercises as instructed by your physician, physical therapist, or athletic trainer. Progress the resistance and repetitions only as guided.  · You may experience muscle soreness or fatigue, but the pain or discomfort you are trying to eliminate should never worsen during these exercises. If this pain does worsen, stop and make certain you are following the directions exactly. If the pain is still present after adjustments, discontinue the exercise until you can discuss the trouble with your clinician.  STRENGTH - Cervical Flexors, Isometric  · Face a wall, standing about 6 inches away. Place a small pillow, a ball about 6-8 inches in diameter, or a folded towel between your forehead and the wall.  · Slightly tuck your chin and gently push your forehead into the soft object. Push only with mild to moderate intensity, building up tension gradually. Keep your jaw and forehead relaxed.  · Hold 10 to 20 seconds. Keep your breathing relaxed.  · Release the tension slowly. Relax your neck muscles completely before you start the next repetition.  Repeat __________ times. Complete this exercise __________ times per day.  STRENGTH- Cervical Lateral Flexors, Isometric   · Stand about 6 inches away from a wall. Place a small pillow, a ball about 6-8 inches in diameter, or a folded towel between the side of your head and the wall.  · Slightly tuck your chin and gently tilt your head into the soft object. Push only with mild to moderate intensity, building up tension gradually. Keep your jaw and forehead relaxed.  · Hold 10 to 20 seconds. Keep your breathing relaxed.  · Release the tension slowly. Relax your neck muscles completely before you start the next  repetition.  Repeat __________ times. Complete this exercise __________ times per day.  STRENGTH - Cervical Extensors, Isometric   · Stand about 6 inches away from a wall. Place a small pillow, a ball about 6-8 inches in diameter, or a folded towel between the back of your head and the wall.  · Slightly tuck your chin and gently tilt your head back into the soft object. Push only with mild to moderate intensity, building up tension gradually. Keep your jaw and forehead relaxed.  · Hold 10 to 20 seconds. Keep your breathing relaxed.  · Release the tension slowly. Relax your neck muscles completely before you start the next repetition.  Repeat __________ times. Complete this exercise __________ times per day.    POSTURE AND BODY MECHANICS CONSIDERATIONS - Cervical Strain and Sprain  Keeping correct posture when sitting, standing or completing your activities will reduce the stress put on different body tissues, allowing injured tissues a chance to heal and limiting painful experiences. The following are general guidelines for improved posture. Your physician or physical therapist will provide you with any instructions specific to your needs. While reading these guidelines, remember:  · The exercises prescribed by your provider will help you have the flexibility and strength to maintain correct postures.  · The correct posture provides the optimal environment for your joints to work. All of your joints have less wear and tear when properly supported by a spine with good posture. This means you will experience a healthier, less painful body.  · Correct posture must be practiced with all of your activities, especially prolonged sitting and standing. Correct posture is as important when doing repetitive low-stress activities (typing) as it is when doing a single heavy-load activity (lifting).  PROLONGED STANDING WHILE SLIGHTLY LEANING FORWARD  When completing a task that requires you to lean forward while standing in one  place for a long time, place either foot up on a stationary 2- to 4-inch high object to help maintain the best posture. When both feet are on the ground, the low back tends to lose its slight inward curve. If this curve flattens (or becomes too large), then the back and your other joints will experience too much stress, fatigue more quickly, and can cause pain.   RESTING POSITIONS  Consider which positions are most painful for you when choosing a resting position. If you have pain with flexion-based activities (sitting, bending, stooping, squatting), choose a position that allows you to rest in a less flexed posture. You would want to avoid curling into a fetal position on your side. If your pain worsens with extension-based activities (prolonged standing, working overhead), avoid resting in an extended position such as sleeping on your stomach. Most people will find more comfort when they rest with their spine in a more neutral position, neither too rounded nor too arched. Lying on a non-sagging bed on your side with a pillow between your knees, or on your back with a pillow under your knees will often provide some relief. Keep in mind, being in any one position for a prolonged period of time, no matter how correct your posture, can still lead to stiffness.  WALKING  Walk with an upright posture. Your ears, shoulders, and hips should all line up.  OFFICE WORK  When working at a desk, create an environment that supports good, upright posture. Without extra support, muscles fatigue and lead to excessive strain on joints and other tissues.  CHAIR:  · A chair should be able to slide under your desk when your back makes contact with the back of the chair. This allows you to work closely.  · The chair's height should allow your eyes to be level with the upper part of your monitor and your hands to be slightly lower than your elbows.  · Body position:    Your feet should make contact with the floor. If this is not  possible, use a foot rest.    Keep your ears over your shoulders. This will reduce stress on your neck and low back.     This information is not intended to replace advice given to you by your health care provider. Make sure you discuss any questions you have with your health care provider.       Document Released: 05/04/2005 Document Revised: 05/25/2014 Document Reviewed: 08/16/2008  [employer]vier Interactive Patient Education ©2016 [employer]

## 2015-06-13 NOTE — Discharge Instructions (Signed)

## 2015-06-13 NOTE — ED Notes (Signed)
Dr. Thurmond Butts called report to [facility] charge nurse at [facility] ED.

## 2015-06-13 NOTE — ED Provider Notes (Signed)
CSN: [id number]     Arrival date & time 06/13/15  1119 History   First MD Initiated Contact with Patient 06/13/15 1216    .nursenote Chief Complaint  Patient presents with  . Neck Pain     Patient is a 75 frail-looking black female who comes in complaining of neck pain. She reports pain from neck and shoulder she states it feels like the left side liters are drawing up. Along with this neck pain she denies any trauma or injury no heavy lifting. Of concern is that she's also complaining of some shortness of breath and some atypical chest discomfort as if she's had indigestion also over the last few days. She states she's been using icy hot on her neck and shoulder but has not helped. Use the pain goes away little bit night in the morning she has put more icy hot because the pain comes back when she becomes active again. She has a history hypertension she does not smoke. Family history heart disease in the family with her mother dying of a myocardial infarction. She has several minute family members have diabetes she does not have diabetes but she does have hypertension. She states that she's had orthopedic surgeries and recently Dr. Steele Sizer about a year ago did a stress test in his office and she passed. She has had nuclear stress test was performed the past as well. She denies to having heart disease or any significant blockages at this time.      (Consider location/radiation/quality/duration/timing/severity/associated sxs/prior Treatment) Patient is a 75 y.o. female presenting with neck pain. The history is provided by the patient. No language interpreter was used.  Neck Pain Pain location:  Generalized neck Quality:  Cramping Pain radiates to:  L shoulder Pain severity:  Moderate Pain is:  Worse during the day Duration:  4 days Progression:  Waxing and waning Chronicity:  New Context comment:  None  Relieved by:  Nothing Ineffective treatments: icey  hot. Associated symptoms: chest pain     Chest pain:    Quality:  Aching   Severity:  Mild   Progression:  Waxing and waning   Chronicity:  New Risk factors: no hx of head and neck radiation and no recent head injury     Past Medical History  Diagnosis Date  . Hypertension   . GERD (gastroesophageal reflux disease)   . PE (pulmonary embolism)   . DVT (deep venous thrombosis) [facility])    Past Surgical History  Procedure Laterality Date  . Knee arthroscopy    . Cholecystectomy    . Abdominal hysterectomy     Family History  Problem Relation Age of Onset  . Breast cancer Paternal Aunt    Social History  Substance Use Topics  . Smoking status: Never Smoker   . Smokeless tobacco: None  . Alcohol Use: No   OB History    No data available     Review of Systems  Respiratory: Positive for shortness of breath.   Cardiovascular: Positive for chest pain.  Gastrointestinal: Positive for nausea and abdominal pain.  Musculoskeletal: Positive for neck pain.    Allergies  Sulfa antibiotics  Home Medications   Prior to Admission medications   Not on File   Meds Ordered and Administered this Visit  Medications - No data to display  BP 165/69 mmHg  Pulse 86  Temp(Src) 98.4 F (36.9 C) (Oral)  Resp 17  Ht  (1.575 m)  Wt 170 lb (77.111 kg)  BMI 31.09  kg/m2  SpO2 98% No data found.   Physical Exam  Constitutional: She is oriented to person, place, and time. She appears well-developed. She has a sickly appearance. She appears ill. No distress.  HENT:  Head: Normocephalic and atraumatic.  Right Ear: Hearing and tympanic membrane normal.  Left Ear: Hearing normal.  Nose: Nose normal.  Mouth/Throat: Normal dentition. No dental caries.  Eyes: Conjunctivae are normal. Pupils are equal, round, and reactive to light.  Neck: Normal range of motion. Neck supple. Muscular tenderness present.    She's got tenderness along the left trapezius muscle along the both the cervical and shoulder muscles   Cardiovascular: Normal rate, regular rhythm and normal heart sounds.   Pulmonary/Chest: Effort normal and breath sounds normal. No respiratory distress.  Abdominal: Soft.  Musculoskeletal: She exhibits tenderness.  Neurological: She is alert and oriented to person, place, and time.  Skin: Skin is warm and dry. No erythema.  Vitals reviewed.   ED Course  Procedures (including critical care time)  Labs Review Labs Reviewed - No data to display  Imaging Review No results found.   Visual Acuity Review  Right Eye Distance:   Left Eye Distance:   Bilateral Distance:    Right Eye Near:   Left Eye Near:    Bilateral Near:         MDM   1. Atypical chest pain   2. Shortness of breath   3. Neck pain   4. Muscle spasms of neck      This time a very suspicious that she's got left trapezius muscle strain and is probably was causing most of her discomfort how after patient has hypertension she has a strong family history of heart disease she is also complaining of shortness of breath and some atypical chest discomfort which may just be reflux. Because of the multitude of other complaints and went patient questioned she is also worried about her heart feel that only option is to send it to the ER for closer evaluation monitoring and cardiac evaluation that we cannot do here. She and her daughter are agreeable with that and will go to [facility] discussed case with charge nurse Noreene Larsson who is expecting her period should be noted the EKG showed no signs of acute injury and EMS was offered but they declined. ED ECG REPORT I, Nellene Courtois H, the attending physician, personally viewed and interpreted this ECG.   Date: 06/13/2015  EKG Time: 12:19:13  Rate: 86  Rhythm: normal sinus rhythm  Axis: 17  Intervals:Left hypertrophy  ST&T Change: None  Report on the EKG left ventricle hypertrophy sinus rhythm  Hassan Rowan, MD 06/13/15 1249

## 2015-06-13 NOTE — ED Provider Notes (Signed)
[facility] Emergency Department Provider Note  ____________________________________________  Time seen: Approximately 4:24 PM  I have reviewed the triage vital signs and the nursing notes.   HISTORY  Chief Complaint Neck Pain and Arm Pain    HPI [name] is a 75 y.o. female history of hypertension, GERD, PE on Coumadin who presents for evaluation of 3 days atraumatic left neck/trapezius pain radiating to the left shoulder, worse with movement of the left shoulder, constant since onset, moderate today. She was seen in urgent care earlier today and her neck pain was thought to be secondary to muscle spasm or trapezius strain however she also had endorsed some degree of chest pain and so they sent her to the emergency department for further evaluation. The patient reports to me that several weeks ago she had some chest discomfort at night which she thought was secondary to indigestion. She reports she has not had any chest pain for several weeks and is not sure why she was sent to the emergency department. She does report that she becomes slightly short of breath when her neck pain is at its maximum. He denies any shortness of breath currently. She denies any cough, fever, vomiting, diarrhea, chills or dysuria.   Past Medical History  Diagnosis Date  . Hypertension   . GERD (gastroesophageal reflux disease)   . PE (pulmonary embolism)   . DVT (deep venous thrombosis) (HCC)     There are no active problems to display for this patient.   Past Surgical History  Procedure Laterality Date  . Knee arthroscopy    . Cholecystectomy    . Abdominal hysterectomy      Current Outpatient Rx  Name  Route  Sig  Dispense  Refill  . acetaminophen (TYLENOL) 500 MG tablet   Oral   Take 500 mg by mouth every 6 (six) hours as needed.         Marland Kitchen omeprazole (PRILOSEC) 20 MG capsule   Oral   Take 20 mg by mouth daily.      2   . potassium chloride SA  (K-DUR,KLOR-CON) 20 MEQ tablet   Oral   Take 20 mEq by mouth 2 (two) times daily.      2   . triamterene-hydrochlorothiazide (MAXZIDE-25) 37.5-25 MG tablet   Oral   Take 1 tablet by mouth daily.      0   . warfarin (COUMADIN) 4 MG tablet   Oral   Take 4.5 mg by mouth daily.      2   . diazepam (VALIUM) 2 MG tablet   Oral   Take 1 tablet (2 mg total) by mouth every 8 (eight) hours as needed for muscle spasms. Only take these when you are lying in bed and supervised as they can make you dizzy and increase  Risk of falling.   12 tablet   0   . nitrofurantoin, macrocrystal-monohydrate, (MACROBID) 100 MG capsule   Oral   Take 1 capsule (100 mg total) by mouth 2 (two) times daily.   14 capsule   0     Allergies Percocet and Sulfa antibiotics  Family History  Problem Relation Age of Onset  . Breast cancer Paternal Aunt     Social History Social History  Substance Use Topics  . Smoking status: Never Smoker   . Smokeless tobacco: None  . Alcohol Use: No    Review of Systems Constitutional: No fever/chills Eyes: No visual changes. ENT: No sore throat. Cardiovascular: +  chest pain several weeks ago. Respiratory: + shortness of breath. Gastrointestinal: No abdominal pain.  No nausea, no vomiting.  No diarrhea.  No constipation. Genitourinary: Negative for dysuria. Musculoskeletal: Negative for back pain. Skin: Negative for rash. Neurological: Negative for headaches, focal weakness or numbness.  10-point ROS otherwise negative.  ____________________________________________   PHYSICAL EXAM:  VITAL SIGNS: ED Triage Vitals  Enc Vitals Group     BP 06/13/15 1333 162/71 mmHg     Pulse Rate 06/13/15 1333 88     Resp 06/13/15 1333 18     Temp --      Temp Source 06/13/15 1333 Oral     SpO2 06/13/15 1333 98 %     Weight 06/13/15 1333 170 lb (77.111 kg)     Height 06/13/15 1333  (1.575 m)     Head Cir --      Peak Flow --      Pain Score 06/13/15 1333 10      Pain Loc --      Pain Edu? --      Excl. in GC? --     Constitutional: Alert and oriented. Well appearing and in no acute distress. Eyes: Conjunctivae are normal. PERRL. EOMI. Head: Atraumatic. Nose: No congestion/rhinnorhea. Mouth/Throat: Mucous membranes are moist.  Oropharynx non-erythematous. Neck: No stridor.  There is significant tenderness to palpation in the left trapezius. Cardiovascular: Normal rate, regular rhythm. Grossly normal heart sounds.  Good peripheral circulation. Respiratory: Normal respiratory effort.  No retractions. Lungs CTAB. Gastrointestinal: Soft and nontender. No distention. No abdominal bruits. No CVA tenderness. Genitourinary: Deferred Musculoskeletal: Patient has painful but full range of motion in the left shoulder. 2+ left radial pulse. Neurologic:  Normal speech and language. No gross focal neurologic deficits are appreciated. No gait instability. Equal grip strength bilaterally. Sensation intact to light touch throughout. Skin:  Skin is warm, dry and intact. No rash noted. Psychiatric: Mood and affect are normal. Speech and behavior are normal.  ____________________________________________   LABS (all labs ordered are listed, but only abnormal results are displayed)  Labs Reviewed  BASIC METABOLIC PANEL - Abnormal; Notable for the following:    Sodium 134 (*)    All other components within normal limits  PROTIME-INR - Abnormal; Notable for the following:    Prothrombin Time 23.0 (*)    All other components within normal limits  URINALYSIS COMPLETEWITH MICROSCOPIC ([facility] ONLY) - Abnormal; Notable for the following:    Color, Urine YELLOW (*)    APPearance CLEAR (*)    Hgb urine dipstick 3+ (*)    Bacteria, UA MANY (*)    Squamous Epithelial / LPF 0-5 (*)    All other components within normal limits  CBC  TROPONIN I   ____________________________________________  EKG  ED ECG REPORT I, Gayla Doss, the attending physician,  personally viewed and interpreted this ECG.   Date: 06/13/2015  EKG Time: 13:36  Rate: 88  Rhythm: normal sinus rhythm  Axis: normal  Intervals:none  ST&T Change: No acute ST elevation. Nonspecific T-wave flattening in aVF him a V4. EKG is generally unchanged when compared to 08/11/2011.  ____________________________________________  RADIOLOGY  CXR IMPRESSION: Chronic bronchitic markings and basilar atelectasis. No acute findings.  ____________________________________________   PROCEDURES  Procedure(s) performed: None  Critical Care performed: No  ____________________________________________   INITIAL IMPRESSION / ASSESSMENT AND PLAN / ED COURSE  Pertinent labs & imaging results that were available during my care of the patient were reviewed by me and  considered in my medical decision making (see chart for details).  RAMEY KETCHERSIDE is a 75 y.o. female history of hypertension, GERD, PE on Coumadin who presents for evaluation of 3 days atraumatic left neck/trapezius pain radiating to the left shoulder. On exam, she is generally well-appearing and in no acute distress. Vital signs stable, she is afebrile. Her exam is consistent with likely trapezius strain with possibly some component of spasm. Question cervical radiculopathy without any weakness. She has not had chest pain in several weeks, her EKG is unchanged from prior, her troponin is negative. I doubt ACS, acute aortic dissection or PE. CBC and BMP are generally unremarkable. INR is therapeutic at 2.05. Urinalysis concerning for possible UTI and she reports she is concerned she may have a urinary tract infection as she has had them in the past. Normal creatinine, we'll discharge with Macrobid. Her pain is improved significantly with by mouth Valium. She is concerned about cycle benzopyrene and Coumadin interactions reports that she cannot take oxycodone so this appears to be one of the only reasonable choices for muscle  laxation/pain control, she has already been taking Tylenol at home. We discussed specific his return precautions as well as need for close PCP follow-up and she is comfortable with the discharge plan. DC home. ____________________________________________   FINAL CLINICAL IMPRESSION(S) / ED DIAGNOSES  Final diagnoses:  Neck pain, acute  Trapezius strain, left, initial encounter  UTI (lower urinary tract infection)      Gayla Doss, MD 06/13/15 [id number]

## 2015-06-13 NOTE — ED Notes (Signed)
States neck and shoulder tightness since Monday, pt was sent from [facility], pt denies any chest pain but states "I am just sore", states some SOB, pt on coumadin

## 2015-06-15 LAB — URINE CULTURE: Culture: NO GROWTH

## 2015-09-08 ENCOUNTER — Ambulatory Visit
Admission: EM | Admit: 2015-09-08 | Discharge: 2015-09-08 | Disposition: A | Discharge status: Other Acute Inpatient Hospital | Payer: Medicare HMO | Attending: Family Medicine | Admitting: Family Medicine

## 2015-09-08 ENCOUNTER — Emergency Department: Payer: Medicare HMO

## 2015-09-08 ENCOUNTER — Emergency Department
Admission: EM | Admit: 2015-09-08 | Discharge: 2015-09-08 | Disposition: A | Discharge status: Home / Self Care | Payer: Medicare HMO | Attending: Emergency Medicine | Admitting: Emergency Medicine

## 2015-09-08 ENCOUNTER — Encounter: Payer: Self-pay | Admitting: *Deleted

## 2015-09-08 ENCOUNTER — Encounter: Payer: Self-pay | Admitting: Emergency Medicine

## 2015-09-08 DIAGNOSIS — I10 Essential (primary) hypertension: Secondary | ICD-10-CM | POA: Insufficient documentation

## 2015-09-08 DIAGNOSIS — Z79899 Other long term (current) drug therapy: Secondary | ICD-10-CM | POA: Insufficient documentation

## 2015-09-08 DIAGNOSIS — M7989 Other specified soft tissue disorders: Secondary | ICD-10-CM | POA: Diagnosis present

## 2015-09-08 DIAGNOSIS — Z86711 Personal history of pulmonary embolism: Secondary | ICD-10-CM | POA: Insufficient documentation

## 2015-09-08 DIAGNOSIS — M79662 Pain in left lower leg: Secondary | ICD-10-CM | POA: Insufficient documentation

## 2015-09-08 DIAGNOSIS — Z86718 Personal history of other venous thrombosis and embolism: Secondary | ICD-10-CM | POA: Insufficient documentation

## 2015-09-08 DIAGNOSIS — M660 Rupture of popliteal cyst: Secondary | ICD-10-CM

## 2015-09-08 DIAGNOSIS — M79605 Pain in left leg: Secondary | ICD-10-CM

## 2015-09-08 DIAGNOSIS — R58 Hemorrhage, not elsewhere classified: Secondary | ICD-10-CM

## 2015-09-08 LAB — CBC WITH DIFFERENTIAL/PLATELET
Basophils Absolute: 0 10*3/uL (ref 0–0.1)
Basophils Relative: 0 %
Eosinophils Absolute: 0.1 10*3/uL (ref 0–0.7)
Eosinophils Relative: 1 %
HEMATOCRIT: 40.8 % (ref 35.0–47.0)
Hemoglobin: 13.9 g/dL (ref 12.0–16.0)
LYMPHS ABS: 2.2 10*3/uL (ref 1.0–3.6)
LYMPHS PCT: 24 %
MCH: 33.1 pg (ref 26.0–34.0)
MCHC: 34.2 g/dL (ref 32.0–36.0)
MCV: 97 fL (ref 80.0–100.0)
MONO ABS: 1.3 10*3/uL — AB (ref 0.2–0.9)
MONOS PCT: 13 %
NEUTROS ABS: 5.9 10*3/uL (ref 1.4–6.5)
Neutrophils Relative %: 62 %
PLATELETS: 189 10*3/uL (ref 150–440)
RBC: 4.2 MIL/uL (ref 3.80–5.20)
RDW: 12.9 % (ref 11.5–14.5)
WBC: 9.5 10*3/uL (ref 3.6–11.0)

## 2015-09-08 LAB — PROTIME-INR
INR: 1.78
Prothrombin Time: 20.7 seconds — ABNORMAL HIGH (ref 11.4–15.0)

## 2015-09-08 NOTE — ED Notes (Signed)
[facility] for evaluation of left leg swelling +pain +Hx DVT. Patient currently on warfarin

## 2015-09-08 NOTE — ED Notes (Signed)
Left leg/calf/knee edema and pain x3-4 days. Denies injury, states gradual onset. Left calf swollen and warm to touch, Left knee appears edematous.

## 2015-09-08 NOTE — Discharge Instructions (Signed)
Please seek medical attention for any high fevers, chest pain, shortness of breath, change in behavior, persistent vomiting, bloody stool or any other new or concerning symptoms.   Baker Cyst A Baker cyst is a sac-like structure that forms in the back of the knee. It is filled with the same fluid that is located in your knee. This fluid lubricates the bones and cartilage of the knee and allows them to move over each other more easily. CAUSES  When the knee becomes injured or inflamed, increased fluid forms in the knee. When this happens, the joint lining is pushed out behind the knee and forms the Baker cyst. This cyst may also be caused by inflammation from arthritic conditions and infections. SIGNS AND SYMPTOMS  A Baker cyst usually has no symptoms. When the cyst is substantially enlarged:  You may feel pressure behind the knee, stiffness in the knee, or a mass in the area behind the knee.  You may develop pain, redness, and swelling in the calf. This can suggest a blood clot and requires evaluation by your health care provider. DIAGNOSIS  A Baker cyst is most often found during an ultrasound exam. This exam may have been performed for other reasons, and the cyst was found incidentally. Sometimes an MRI is used. This picks up other problems within a joint that an ultrasound exam may not. If the Baker cyst developed immediately after an injury, X-ray exams may be used to diagnose the cyst. TREATMENT  The treatment depends on the cause of the cyst. Anti-inflammatory medicines and rest often will be prescribed. If the cyst is caused by a bacterial infection, antibiotic medicines may be prescribed.  HOME CARE INSTRUCTIONS   If the cyst was caused by an injury, for the first 24 hours, keep the injured leg elevated on 2 pillows while lying down.  For the first 24 hours while you are awake, apply ice to the injured area:  Put ice in a plastic bag.  Place a towel between your skin and the  bag.  Leave the ice on for 20 minutes, 2-3 times a day.  Only take over-the-counter or prescription medicines for pain, discomfort, or fever as directed by your health care provider.  Only take antibiotic medicine as directed. Make sure to finish it even if you start to feel better. MAKE SURE YOU:   Understand these instructions.  Will watch your condition.  Will get help right away if you are not doing well or get worse.   This information is not intended to replace advice given to you by your health care provider. Make sure you discuss any questions you have with your health care provider.   Document Released: 05/04/2005 Document Revised: 02/22/2013 Document Reviewed: 12/14/2012 Elsevier Interactive Patient Education [employer]2016 Elsevier Inc.

## 2015-09-08 NOTE — ED Provider Notes (Signed)
[facility] Emergency Department Provider Note    ____________________________________________  Time seen: ~1515  I have reviewed the triage vital signs and the nursing notes.   HISTORY  Chief Complaint Leg Swelling   History limited by: Not Limited   HPI [name] is a 75 y.o. female who presents to the emergency department from urgent care today because of concerns for left leg swelling and bruising. She stated that the symptoms started about 3 days ago. She states she did not have any trauma to her leg. She states the swelling has been roughly the same since it started. She did have some dizziness on the day it started. The patient states she has had continuous pain there. She is on warfarin. States last INR check was a couple of weeks ago and that they told her to maintain her current dose.   Past Medical History  Diagnosis Date  . Hypertension   . GERD (gastroesophageal reflux disease)   . PE (pulmonary embolism)   . DVT (deep venous thrombosis) (HCC)     There are no active problems to display for this patient.   Past Surgical History  Procedure Laterality Date  . Knee arthroscopy    . Cholecystectomy    . Abdominal hysterectomy      Current Outpatient Rx  Name  Route  Sig  Dispense  Refill  . acetaminophen (TYLENOL) 500 MG tablet   Oral   Take 500 mg by mouth every 6 (six) hours as needed.         . diazepam (VALIUM) 2 MG tablet   Oral   Take 1 tablet (2 mg total) by mouth every 8 (eight) hours as needed for muscle spasms. Only take these when you are lying in bed and supervised as they can make you dizzy and increase  Risk of falling.   12 tablet   0   . omeprazole (PRILOSEC) 20 MG capsule   Oral   Take 20 mg by mouth daily.      2   . potassium chloride SA (K-DUR,KLOR-CON) 20 MEQ tablet   Oral   Take 20 mEq by mouth 2 (two) times daily.      2   . triamterene-hydrochlorothiazide (MAXZIDE-25) 37.5-25 MG tablet    Oral   Take 1 tablet by mouth daily.      0   . warfarin (COUMADIN) 4 MG tablet   Oral   Take 4.5 mg by mouth daily.      2     Allergies Percocet and Sulfa antibiotics  Family History  Problem Relation Age of Onset  . Breast cancer Paternal Aunt     Social History Social History  Substance Use Topics  . Smoking status: Never Smoker   . Smokeless tobacco: None  . Alcohol Use: No    Review of Systems  Constitutional: Negative for fever. Cardiovascular: Negative for chest pain. Respiratory: Negative for shortness of breath. Gastrointestinal: Negative for abdominal pain, vomiting and diarrhea. Neurological: Negative for headaches, focal weakness or numbness.  10-point ROS otherwise negative.  ____________________________________________   PHYSICAL EXAM:  VITAL SIGNS: ED Triage Vitals  Enc Vitals Group     BP 09/08/15 1406 140/77 mmHg     Pulse Rate 09/08/15 1406 96     Resp 09/08/15 1406 18     Temp 09/08/15 1406 97.7 F (36.5 C)     Temp Source 09/08/15 1406 Oral     SpO2 09/08/15 1406 94 %  Weight 09/08/15 1406 170 lb (77.111 kg)     Height --      Head Cir --      Peak Flow --      Pain Score 09/08/15 1407 5   Constitutional: Alert and oriented. Well appearing and in no distress. Eyes: Conjunctivae are normal. PERRL. Normal extraocular movements. ENT   Head: Normocephalic and atraumatic.   Nose: No congestion/rhinnorhea.   Mouth/Throat: Mucous membranes are moist.   Neck: No stridor. Hematological/Lymphatic/Immunilogical: No cervical lymphadenopathy. Cardiovascular: Normal rate, regular rhythm.  No murmurs, rubs, or gallops. Respiratory: Normal respiratory effort without tachypnea nor retractions. Breath sounds are clear and equal bilaterally. No wheezes/rales/rhonchi. Gastrointestinal: Soft and nontender. No distention.  Genitourinary: Deferred Musculoskeletal: Normal range of motion in all extremities. No joint effusions.   Left lower leg with some swelling. Bruising noted to the medial and volar aspect. Compartments are soft. Neurologic:  Normal speech and language. No gross focal neurologic deficits are appreciated.  Skin:  Skin is warm, dry and intact. No rash noted. Psychiatric: Mood and affect are normal. Speech and behavior are normal. Patient exhibits appropriate insight and judgment.  ____________________________________________    LABS (pertinent positives/negatives)  Labs Reviewed  PROTIME-INR - Abnormal; Notable for the following:    Prothrombin Time 20.7 (*)    All other components within normal limits  CBC WITH DIFFERENTIAL/PLATELET - Abnormal; Notable for the following:    Monocytes Absolute 1.3 (*)    All other components within normal limits     ____________________________________________   EKG  None  ____________________________________________    RADIOLOGY  US lower extremity IMPRESSION: No evidence of deep venous thrombosis.  Posterior calf soft tissue edema, with small linear fluid collection extending inferiorly in the left calf which could be due to hematoma or ruptured Baker cyst.  ____________________________________________   PROCEDURES  Procedure(s) performed: None  Critical Care performed: No  ____________________________________________   INITIAL IMPRESSION / ASSESSMENT AND PLAN / ED COURSE  Pertinent labs & imaging results that were available during my care of the patient were reviewed by me and considered in my medical decision making (see chart for details).  Patient presented to the emergency department today because of concerns for left leg swelling and bruising. Ultrasound was performed which did not show any deep vein thrombosis. It did however suggests hematoma versus ruptured Baker cyst. Exam is consistent with a large area of ecchymosis. Given that patient is on warfarin will check an INR and  hemoglobin.  ----------------------------------------- 3:58 PM on 09/08/2015 -----------------------------------------  INR and hemoglobin within normal limits. Will plan on discharging patient home with rice. Will have patient follow-up with primary care.  ____________________________________________   FINAL CLINICAL IMPRESSION(S) / ED DIAGNOSES  Final diagnoses:  Pain and swelling of left lower extremity     Phineas SemenGraydon Yoni Lobos, MD 09/08/15 1559

## 2015-09-08 NOTE — ED Notes (Signed)
Right knee 17.5" / Left knee 18.5" Right calf 17" / Left calf 17.5"

## 2015-09-08 NOTE — Discharge Instructions (Signed)
Edema °Edema is an abnormal buildup of fluids. It is more common in your legs and thighs. Painless swelling of the feet and ankles is more likely as a person ages. It also is common in looser skin, like around your eyes. °HOME CARE  °· Keep the affected body part above the level of the heart while lying down. °· Do not sit still or stand for a long time. °· Do not put anything right under your knees when you lie down. °· Do not wear tight clothes on your upper legs. °· Exercise your legs to help the puffiness (swelling) go down. °· Wear elastic bandages or support stockings as told by your doctor. °· A low-salt diet may help lessen the puffiness. °· Only take medicine as told by your doctor. °GET HELP IF: °· Treatment is not working. °· You have heart, liver, or kidney disease and notice that your skin looks puffy or shiny. °· You have puffiness in your legs that does not get better when you raise your legs. °· You have sudden weight gain for no reason. °GET HELP RIGHT AWAY IF:  °· You have shortness of breath or chest pain. °· You cannot breathe when you lie down. °· You have pain, redness, or warmth in the areas that are puffy. °· You have heart, liver, or kidney disease and get edema all of a sudden. °· You have a fever and your symptoms get worse all of a sudden. °MAKE SURE YOU:  °· Understand these instructions. °· Will watch your condition. °· Will get help right away if you are not doing well or get worse. °  °This information is not intended to replace advice given to you by your health care provider. Make sure you discuss any questions you have with your health care provider. °  °Document Released: 10/21/2007 Document Revised: 05/09/2013 Document Reviewed: 02/24/2013 °Elsevier Interactive Patient Education ©2016 Elsevier Inc. ° °

## 2015-09-08 NOTE — ED Provider Notes (Signed)
CSN: [id number]     Arrival date & time 09/08/15  1058 History   First MD Initiated Contact with Patient 09/08/15 1232    Nurses notes were reviewed. Chief Complaint  Patient presents with  . Leg Swelling  . Weakness  . Leg Pain    She is here because of swelling of the left leg.. 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 She's had both knees replaced surgeries on both knees out recently. She states that Monday the left leg started swelling. According to the daughter she's also has had bruising behind the left leg and along the leg but patient denies any trauma to the left leg as well. She was hoping things would get better on the own but with increased pressure and swelling and discomfort she came in to be seen and evaluated. She denies any shortness of breath or chest pain. Or fever.  She's had a history of hypertension and PE before in the past she's had abdominal hysterectomy and cholecystectomy. Family medical history is positive for paternal breast cancer. She states she's never smoked before the past.   (Consider location/radiation/quality/duration/timing/severity/associated sxs/prior Treatment) Patient is a 75 y.o. female presenting with weakness and leg pain. The history is provided by the patient and a relative. No language interpreter was used.  Weakness This is a new problem. The current episode started more than 2 days ago. The problem occurs constantly. The problem has been gradually worsening. Pertinent negatives include no chest pain, no abdominal pain, no headaches and no shortness of breath. The symptoms are aggravated by walking. She has tried nothing for the symptoms. The treatment provided no relief.  Leg Pain   Past Medical History  Diagnosis Date  . Hypertension   . GERD (gastroesophageal reflux disease)   . PE (pulmonary embolism)   . DVT (deep venous thrombosis) [facility])    Past Surgical History  Procedure  Laterality Date  . Knee arthroscopy    . Cholecystectomy    . Abdominal hysterectomy     Family History  Problem Relation Age of Onset  . Breast cancer Paternal Aunt    Social History  Substance Use Topics  . Smoking status: Never Smoker   . Smokeless tobacco: None  . Alcohol Use: No   OB History    No data available     Review of Systems  Respiratory: Negative for shortness of breath.   Cardiovascular: Negative for chest pain.  Gastrointestinal: Negative for abdominal pain.  Neurological: Positive for weakness. Negative for headaches.  All other systems reviewed and are negative.   Allergies  Percocet and Sulfa antibiotics  Home Medications   Prior to Admission medications   Medication Sig Start Date End Date Taking? Authorizing Provider  acetaminophen (TYLENOL) 500 MG tablet Take 500 mg by mouth every 6 (six) hours as needed.   Yes Historical Provider, MD  omeprazole (PRILOSEC) 20 MG capsule Take 20 mg by mouth daily.   Yes Historical Provider, MD  potassium chloride SA (K-DUR,KLOR-CON) 20 MEQ tablet Take 20 mEq by mouth 2 (two) times daily.   Yes Historical Provider, MD  triamterene-hydrochlorothiazide (MAXZIDE-25) 37.5-25 MG tablet Take 1 tablet by mouth daily.   Yes Historical Provider, MD  warfarin (COUMADIN) 4 MG tablet Take 4.5 mg by mouth daily.   Yes Historical Provider, MD  diazepam (VALIUM) 2 MG tablet Take 1 tablet (2 mg total) by mouth every 8 (eight) hours as needed for muscle  spasms. Only take these when you are lying in bed and supervised as they can make you dizzy and increase  Risk of falling. 06/13/15 06/12/16  Gayla DossEryka A Gayle, MD   Meds Ordered and Administered this Visit  Medications - No data to display  BP 147/68 mmHg  Pulse 76  Temp(Src) 97.9 F (36.6 C) (Oral)  Resp 20  Ht 5\' 2"  (1.575 m)  Wt 170 lb (77.111 kg)  BMI 31.09 kg/m2  SpO2 96% No data found.   Physical Exam  Constitutional: She is oriented to person, place, and time. She  appears well-developed and well-nourished.  HENT:  Head: Normocephalic.  Neck: Normal range of motion.  Cardiovascular: Normal rate, regular rhythm and normal heart sounds.   Pulmonary/Chest: Effort normal and breath sounds normal.  Musculoskeletal: Normal range of motion. She exhibits tenderness.       Left knee: She exhibits swelling and ecchymosis. Tenderness found.       Legs: Homans sign of the left leg was negative there is swelling though in the calf of the left leg and increase ecchymosis behind the left lower leg as well. As stated above is no shortness of breath but the left leg despite his large than the right which is only been going on since about Wednesday.  Neurological: She is alert and oriented to person, place, and time.  Skin: Skin is warm. There is erythema.  Psychiatric: She has a normal mood and affect. Her speech is normal and behavior is normal.  Vitals reviewed.   ED Course  Procedures (including critical care time)  Labs Review Labs Reviewed - No data to display  Imaging Review No results found.   Visual Acuity Review  Right Eye Distance:   Left Eye Distance:   Bilateral Distance:    Right Eye Near:   Left Eye Near:    Bilateral Near:         MDM   1. Pain and swelling of lower leg, left    Discussed with Tammy SoursGreg charge nurse at [facility] ED and will send patient by private car for further evaluation concern over possible deep vein thrombosis and with that much swelling with patient May need be in the hospital. Discussed that with her daughter and daughter's in agreement.   Note: This dictation was prepared with Dragon dictation along with smaller phrase technology. Any transcriptional errors that result from this process are unintentional.  Hassan RowanEugene Madylyn Insco, MD 09/08/15 1344

## 2016-02-11 ENCOUNTER — Other Ambulatory Visit: Payer: Self-pay | Admitting: Internal Medicine

## 2016-02-11 DIAGNOSIS — Z1231 Encounter for screening mammogram for malignant neoplasm of breast: Secondary | ICD-10-CM

## 2016-02-17 ENCOUNTER — Ambulatory Visit
Admission: RE | Admit: 2016-02-17 | Discharge: 2016-02-17 | Disposition: A | Discharge status: Home / Self Care | Payer: Medicare HMO | Source: Ambulatory Visit | Attending: Internal Medicine | Admitting: Internal Medicine

## 2016-02-17 DIAGNOSIS — Z1231 Encounter for screening mammogram for malignant neoplasm of breast: Secondary | ICD-10-CM | POA: Insufficient documentation

## 2016-04-07 ENCOUNTER — Encounter: Payer: Self-pay | Admitting: Urology

## 2016-04-07 ENCOUNTER — Other Ambulatory Visit: Payer: Self-pay | Admitting: Urology

## 2016-04-07 ENCOUNTER — Ambulatory Visit (INDEPENDENT_AMBULATORY_CARE_PROVIDER_SITE_OTHER): Payer: Medicare HMO | Admitting: Urology

## 2016-04-07 VITALS — BP 119/73 | HR 80 | Ht 62.0 in | Wt 172.2 lb

## 2016-04-07 DIAGNOSIS — M545 Low back pain, unspecified: Secondary | ICD-10-CM

## 2016-04-07 DIAGNOSIS — R3129 Other microscopic hematuria: Secondary | ICD-10-CM

## 2016-04-07 DIAGNOSIS — N8110 Cystocele, unspecified: Secondary | ICD-10-CM | POA: Diagnosis not present

## 2016-04-07 DIAGNOSIS — R300 Dysuria: Secondary | ICD-10-CM

## 2016-04-07 MED ORDER — AMOXICILLIN-POT CLAVULANATE 875-125 MG PO TABS: 1.0000 | ORAL_TABLET | Freq: Two times a day (BID) | ORAL | 0 refills | Status: DC

## 2016-04-07 NOTE — Progress Notes (Signed)
04/07/2016 4:45 PM   Tyrell AntonioEarlene Domenick GongM Widdowson Oct 01, 1940 [id number]  Referring provider: Sherrie MustacheFayegh Jadali [address] [place], [place] [address]  Chief Complaint  Patient presents with  . Dysuria    HPI: Patient is a 75 -year-old African-American female who is referred to us by, Dr. Dario GuardianJadali, for dysuria.  Patient states that she has had dysuria for the last several weeks.  She is also having frequency, nocturia, back pain and suprapubic pain  She denies gross hematuria, abdominal pain and flank pain.  She has not had any recent fevers, chills, nausea or vomiting.   She does not have a history of nephrolithiasis, GU surgery or GU trauma.  She is not sexually active.  She is post menopausal.   She denies constipation and/or diarrhea.  She does engage in good perineal hygiene.  She does not take tub baths.   She does not have incontinence.  She is having pain with bladder filling.    She had a non contrast CT which noted pelvic floor laxity with a cystocele.    She is drinking a lot of water daily.    PMH: Past Medical History:  Diagnosis Date  . DVT (deep venous thrombosis) (HCC)   . GERD (gastroesophageal reflux disease)   . Hypertension   . PE (pulmonary embolism)     Surgical History: Past Surgical History:  Procedure Laterality Date  . ABDOMINAL HYSTERECTOMY    . CHOLECYSTECTOMY    . KNEE ARTHROSCOPY      Home Medications:    Medication List       Accurate as of 04/07/16  4:45 PM. Always use your most recent med list.          acetaminophen 500 MG tablet Commonly known as:  TYLENOL Take 500 mg by mouth every 6 (six) hours as needed.   amoxicillin-clavulanate 875-125 MG tablet Commonly known as:  AUGMENTIN Take 1 tablet by mouth every 12 (twelve) hours.   diazepam 2 MG tablet Commonly known as:  VALIUM Take 1 tablet (2 mg total) by mouth every 8 (eight) hours as needed for muscle spasms. Only take these when you are lying in bed and supervised as they can  make you dizzy and increase  Risk of falling.   lisinopril 10 MG tablet Commonly known as:  PRINIVIL,ZESTRIL Take 10 mg by mouth daily.   omeprazole 20 MG capsule Commonly known as:  PRILOSEC Take 20 mg by mouth daily.   potassium chloride SA 20 MEQ tablet Commonly known as:  K-DUR,KLOR-CON Take 20 mEq by mouth 2 (two) times daily.   triamterene-hydrochlorothiazide 37.5-25 MG tablet Commonly known as:  MAXZIDE-25 Take 1 tablet by mouth daily.   warfarin 4 MG tablet Commonly known as:  COUMADIN Take 4.5 mg by mouth daily.       Allergies:  Allergies  Allergen Reactions  . Percocet [Oxycodone-Acetaminophen] Itching  . Sulfa Antibiotics Rash    Family History: Family History  Problem Relation Age of Onset  . Breast cancer Paternal Aunt 1280  . Kidney cancer Neg Hx   . Bladder Cancer Neg Hx     Social History:  reports that she has never smoked. She does not have any smokeless tobacco history on file. She reports that she does not drink alcohol. Her drug history is not on file.  ROS: UROLOGY Frequent Urination?: Yes Hard to postpone urination?: No Burning/pain with urination?: Yes Get up at night to urinate?: Yes Leakage of urine?: No Urine stream starts and  stops?: No Trouble starting stream?: No Do you have to strain to urinate?: No Blood in urine?: No Urinary tract infection?: No Sexually transmitted disease?: No Injury to kidneys or bladder?: No Painful intercourse?: No Weak stream?: No Currently pregnant?: No Vaginal bleeding?: No Last menstrual period?: n  Gastrointestinal Nausea?: No Vomiting?: No Indigestion/heartburn?: No Diarrhea?: No Constipation?: No  Constitutional Fever: No Night sweats?: No Weight loss?: No Fatigue?: No  Skin Skin rash/lesions?: No Itching?: No  Eyes Blurred vision?: No Double vision?: No  Ears/Nose/Throat Sore throat?: No Sinus problems?: No  Hematologic/Lymphatic Swollen glands?: No Easy bruising?:  No  Cardiovascular Leg swelling?: No Chest pain?: No  Respiratory Cough?: No Shortness of breath?: No  Endocrine Excessive thirst?: No  Musculoskeletal Back pain?: Yes Joint pain?: No  Neurological Headaches?: No Dizziness?: No  Psychologic Depression?: No Anxiety?: No  Physical Exam: BP 119/73 (BP Location: Left Arm, Patient Position: Sitting, Cuff Size: Large)   Pulse 80   Ht 5\' 2"  (1.575 m)   Wt 172 lb 3.2 oz (78.1 kg)   BMI 31.50 kg/m   Constitutional: Well nourished. Alert and oriented, No acute distress. HEENT: [place] AT, moist mucus membranes. Trachea midline, no masses. Cardiovascular: No clubbing, cyanosis, or edema. Respiratory: Normal respiratory effort, no increased work of breathing. GI: Abdomen is soft, non tender, non distended, no abdominal masses. Liver and spleen not palpable.  No hernias appreciated.  Stool sample for occult testing is not indicated.   GU: No CVA tenderness.  No bladder fullness or masses.  Atrophic external genitalia, normal pubic hair distribution, no lesions.  Normal urethral meatus, no lesions, no prolapse, no discharge.   No urethral masses, tenderness and/or tenderness. No bladder fullness, tenderness or masses. Pale vagina mucosa, poor estrogen effect, no discharge, no lesions, poor pelvic support, Grade III cystocele is noted.  Rectocele is noted.  Cervix, uterus and adnexal are surgically absent.  Anus and perineum are without rashes or lesions.    Skin: No rashes, bruises or suspicious lesions. Lymph: No cervical or inguinal adenopathy. Neurologic: Grossly intact, no focal deficits, moving all 4 extremities. Psychiatric: Normal mood and affect.  Laboratory Data: Lab Results  Component Value Date   WBC 9.5 09/08/2015   HGB 13.9 09/08/2015   HCT 40.8 09/08/2015   MCV 97.0 09/08/2015   PLT 189 09/08/2015    Lab Results  Component Value Date   CREATININE 0.81 06/13/2015    Lab Results  Component Value Date   AST 28  08/11/2011   Lab Results  Component Value Date   ALT 23 08/11/2011     Urinalysis > 30 WBC's and 11-30 RBC's.  See EPIC.   Assessment & Plan:    1. Dysuria  - may be due to an UTI  - Patient empirically started on Augmentin 875/125  - urine sent for culture  - Urinalysis, Complete  2. Microscopic hematuria  - will continue to monitor to make sure hematuria clears once her UTI is treated  3. Cystocele  - patient is interested in being evaluated for a pessary  4. Back pain  - per patient, she has a history of back problems  - will obtain back xrays for further evaluation   Return for pending urine culture results.  These notes generated with voice recognition software. I apologize for typographical errors.  Michiel CowboySHANNON Ramiel Forti, PA-C  [facility] [address], [address] [place], [place] [address] [phone]

## 2016-04-08 LAB — URINALYSIS, COMPLETE
Bilirubin, UA: NEGATIVE
GLUCOSE, UA: NEGATIVE
KETONES UA: NEGATIVE
NITRITE UA: NEGATIVE
Protein, UA: NEGATIVE
Specific Gravity, UA: 1.015 (ref 1.005–1.030)
UUROB: 0.2 mg/dL (ref 0.2–1.0)
pH, UA: 6 (ref 5.0–7.5)

## 2016-04-08 LAB — MICROSCOPIC EXAMINATION: EPITHELIAL CELLS (NON RENAL): NONE SEEN /HPF (ref 0–10)

## 2016-04-10 ENCOUNTER — Other Ambulatory Visit: Payer: Self-pay | Admitting: Urology

## 2016-04-10 LAB — CULTURE, URINE COMPREHENSIVE

## 2016-04-10 MED ORDER — NITROFURANTOIN MONOHYD MACRO 100 MG PO CAPS: 100.0000 mg | ORAL_CAPSULE | Freq: Two times a day (BID) | ORAL | 0 refills | Status: DC

## 2016-04-11 ENCOUNTER — Telehealth: Payer: Self-pay | Admitting: Urology

## 2016-04-11 NOTE — Telephone Encounter (Signed)
Patient will need to stop her Augmentin.  We also need to see her back once she finishes her Macrobid.

## 2016-04-13 ENCOUNTER — Telehealth: Payer: Self-pay

## 2016-04-13 NOTE — Telephone Encounter (Signed)
Spoke with pt and made aware. Pt voiced understanding.  

## 2016-04-13 NOTE — Telephone Encounter (Signed)
Spoke with pt in reference to +ucx and probiotic. Pt stated she has picked up both medications at pharmacy.

## 2016-04-13 NOTE — Telephone Encounter (Signed)
-----   Message from Harle BattiestShannon A McGowan, PA-C sent at 04/10/2016  7:09 PM EST ----- Patient has a +UCx.  They need to start Macrobid 100 mg, one capsule twice daily for seven days.  They also need to take a probiotic with the antibiotic course.  The dosage is listed below:  L. acidophilus and L. casei (25 x 109 CFU/day for 2 days, then 50 x 109 CFU/day for duration of the antibiotic course)  I have e scribed their [facility] to [facility].

## 2016-06-09 ENCOUNTER — Encounter: Payer: Medicare HMO | Admitting: Obstetrics and Gynecology

## 2016-06-10 ENCOUNTER — Ambulatory Visit (INDEPENDENT_AMBULATORY_CARE_PROVIDER_SITE_OTHER): Payer: Medicare HMO | Admitting: Obstetrics and Gynecology

## 2016-06-10 ENCOUNTER — Encounter: Payer: Self-pay | Admitting: Obstetrics and Gynecology

## 2016-06-10 VITALS — BP 120/75 | HR 81 | Ht 62.0 in | Wt 175.0 lb

## 2016-06-10 DIAGNOSIS — N8111 Cystocele, midline: Secondary | ICD-10-CM | POA: Diagnosis not present

## 2016-06-10 NOTE — Progress Notes (Signed)
Pt is a 76 yo who was referred her for bladder prolapse per pt. According to notes [facility] - pelvic floor laxity with cystocele. Has had an abdominal hysterectomy.

## 2016-06-10 NOTE — Progress Notes (Signed)
HPI:      Ms. [name] is a 76 y.o. [phone] who LMP was No LMP recorded (exact date). Patient is postmenopausal..  Subjective: She presents today referred by Dr. Milinda CaveMcGowen for cystocele diagnosed by urology. In discussion with the patient she reports no issues from the cystocele. She reports no incidence of pelvic pressure pain or symptoms of pelvic relaxation. She denies daily urine leakage. She occasionally has problems with urine leakage at night but this is usually not a problem because she gets up to urinate. She has specifically stated that she would prefer not to have surgery. In short, although she likely will be found to have a cystocele does not seem to be causing her any issues at this time.    Hx: The following portions of the patient's history were reviewed and updated as appropriate:           She  has a past medical history of DVT (deep venous thrombosis) (HCC); GERD (gastroesophageal reflux disease); Hypertension; and PE (pulmonary embolism). She  does not have a problem list on file. She  has a past surgical history that includes Knee arthroscopy; Cholecystectomy; and Abdominal hysterectomy.        ROS: Constitutional: Denied constitutional symptoms, night sweats, recent illness, fatigue, fever, insomnia and weight loss.  Eyes: Denied eye symptoms, eye pain, photophobia, vision change and visual disturbance.  Ears/Nose/Throat/Neck: Denied ear, nose, throat or neck symptoms, hearing loss, nasal discharge, sinus congestion and sore throat.  Cardiovascular: Denied cardiovascular symptoms, arrhythmia, chest pain/pressure, edema, exercise intolerance, orthopnea and palpitations.  Respiratory: Denied pulmonary symptoms, asthma, pleuritic pain, productive sputum, cough, dyspnea and wheezing.  Gastrointestinal: Denied, gastro-esophageal reflux, melena, nausea and vomiting.  Genitourinary: See HPI for additional information.  Musculoskeletal: Denied musculoskeletal symptoms,  stiffness, swelling, muscle weakness and myalgia.  Dermatologic: Denied dermatology symptoms, rash and scar.  Neurologic: Denied neurology symptoms, dizziness, headache, neck pain and syncope.  Psychiatric: Denied psychiatric symptoms, anxiety and depression.  Endocrine: Denied endocrine symptoms including hot flashes and night sweats.   Meds: She has a current medication list which includes the following prescription(s): acetaminophen, lisinopril, omeprazole, potassium chloride sa, triamterene-hydrochlorothiazide, warfarin, and warfarin.  Objective: Vitals:   06/10/16 0846  BP: 120/75  Pulse: 81   Physical examination    Pelvic:   Vulva: Normal appearance.  No lesions.   Vagina: No lesions or abnormalities noted.  Support: Third degree midline cystocele-minimal rectocele.   Urethra No masses tenderness or scarring.  Meatus Normal size without lesions or prolapse.  Cervix: Surgically absent   Anus: Normal exam.  No lesions.  Perineum: Normal exam.  No lesions.        Bimanual   Uterus: Surgically absent   Adnexae: No masses.  Non-tender to palpation.  Cul-de-sac: Negative for abnormality.           Assessment: 1. Cystocele, midline   She has an obvious cystocele but is asymptomatic with no pelvic pressure or pain symptoms and minimal urine loss. She does not desire surgery or in fact any correction at this time.   Plan:            1.  We have discussed pelvic relaxation and specifically cystocele in detail. I have advised her that if she begins to have any symptoms with this we can readdress the possibility of surgery.   Orders Meds ordered this encounter  Medications  . warfarin (COUMADIN) 1 MG tablet  F/U  Return for Pt to contact us if symptoms worsen.  Elonda Husky, M.D. 06/10/2016 9:56 AM

## 2016-12-13 NOTE — Progress Notes (Signed)
12/14/2016 10:18 AM   [name] [birth year] [id number]  Referring provider: Sherrie MustacheJadali, Fayegh, MD [address] [place], [place] [address]  Chief Complaint  Patient presents with  . Urinary Incontinence    referred by Dr. Cameron ProudJadaliegh  patient last seen in office 03/2016    HPI: Patient is a 76 -year-old [place] female who is referred to us by, Dr. Sherrie MustacheFayegh Jadali, for bladder dropping.    Patient states that she has noticed a vaginal bulge for the last several months.  She is having urinary frequency x 4-7, urgency x 4-7, dysuria and nocturia x 4-7.  She does not have a history of urinary tract infections, STI's or injury to the bladder.   She denies dysuria, gross hematuria, suprapubic pain, back pain, abdominal pain or flank pain.  She has not had any recent fevers, chills, nausea or vomiting.   She does not have a history of nephrolithiasis, GU surgery or GU trauma.   She is not sexually active.  She is post menopausal.   She denies constipation and/or diarrhea.     She has not had any recent imaging studies.    Her PVR is 253 mL.     PMH: Past Medical History:  Diagnosis Date  . DVT (deep venous thrombosis) (HCC)   . GERD (gastroesophageal reflux disease)   . Hypertension   . PE (pulmonary embolism)     Surgical History: Past Surgical History:  Procedure Laterality Date  . ABDOMINAL HYSTERECTOMY    . CHOLECYSTECTOMY    . KNEE ARTHROSCOPY Right    x 2  . KNEE SURGERY Left     Home Medications:  Allergies as of 12/14/2016      Reactions   Percocet [oxycodone-acetaminophen] Itching   Sulfa Antibiotics Rash      Medication List       Accurate as of 12/14/16 10:18 AM. Always use your most recent med list.          acetaminophen 500 MG tablet Commonly known as:  TYLENOL Take 500 mg by mouth every 6 (six) hours as needed.   lisinopril 10 MG tablet Commonly known as:  PRINIVIL,ZESTRIL Take 10 mg by mouth daily.   omeprazole 20 MG  capsule Commonly known as:  PRILOSEC Take 20 mg by mouth daily.   potassium chloride SA 20 MEQ tablet Commonly known as:  K-DUR,KLOR-CON Take 20 mEq by mouth 2 (two) times daily.   triamterene-hydrochlorothiazide 37.5-25 MG tablet Commonly known as:  MAXZIDE-25 Take 1 tablet by mouth 2 (two) times daily.   warfarin 4 MG tablet Commonly known as:  COUMADIN Take 4.5 mg by mouth daily.   warfarin 1 MG tablet Commonly known as:  COUMADIN       Allergies:  Allergies  Allergen Reactions  . Percocet [Oxycodone-Acetaminophen] Itching  . Sulfa Antibiotics Rash    Family History: Family History  Problem Relation Age of Onset  . Breast cancer Paternal Aunt [id number]  . Kidney cancer Neg Hx   . Bladder Cancer Neg Hx     Social History:  reports that she has never smoked. She has never used smokeless tobacco. She reports that she does not drink alcohol or use drugs.  ROS: UROLOGY Frequent Urination?: Yes Hard to postpone urination?: No Burning/pain with urination?: No Get up at night to urinate?: Yes Leakage of urine?: No Urine stream starts and stops?: No Trouble starting stream?: No Do you have to strain to urinate?: No Blood in urine?: No Urinary  tract infection?: No Sexually transmitted disease?: No Injury to kidneys or bladder?: No Painful intercourse?: No Weak stream?: No Currently pregnant?: No Vaginal bleeding?: No Last menstrual period?: n  Gastrointestinal Nausea?: No Vomiting?: No Indigestion/heartburn?: No Diarrhea?: No Constipation?: No  Constitutional Fever: No Night sweats?: No Weight loss?: No Fatigue?: No  Skin Skin rash/lesions?: No Itching?: No  Eyes Blurred vision?: No Double vision?: No  Ears/Nose/Throat Sore throat?: No Sinus problems?: No  Hematologic/Lymphatic Swollen glands?: No Easy bruising?: No  Cardiovascular Leg swelling?: No Chest pain?: No  Respiratory Cough?: No Shortness of breath?: No  Endocrine Excessive  thirst?: No  Musculoskeletal Back pain?: Yes Joint pain?: No  Neurological Headaches?: No Dizziness?: No  Psychologic Depression?: No Anxiety?: No  Physical Exam: BP 130/78   Pulse 80   Ht 5\' 2"  (1.575 m)   Wt 173 lb 14.4 oz (78.9 kg)   BMI 31.81 kg/m   Constitutional: Well nourished. Alert and oriented, No acute distress. HEENT: [place] AT, moist mucus membranes. Trachea midline, no masses. Cardiovascular: No clubbing, cyanosis, or edema. Respiratory: Normal respiratory effort, no increased work of breathing. GI: Abdomen is soft, non tender, non distended, no abdominal masses. Liver and spleen not palpable.  No hernias appreciated.  Stool sample for occult testing is not indicated.   GU: No CVA tenderness.  No bladder fullness or masses.  Atrophic external genitalia, normal pubic hair distribution, no lesions.  Normal urethral meatus, no lesions, no prolapse, no discharge.   No urethral masses, tenderness and/or tenderness. No bladder fullness, tenderness or masses. Pale vagina mucosa, poor estrogen effect, no discharge, no lesions, good pelvic support, Grade III cystocele is noted.  No rectocele noted.  Cervix, uterus and adnexa are surgically absent.  Anus and perineum are without rashes or lesions.    Skin: No rashes, bruises or suspicious lesions. Lymph: No cervical or inguinal adenopathy. Neurologic: Grossly intact, no focal deficits, moving all 4 extremities. Psychiatric: Normal mood and affect.   Pertinent Imaging: Results for Dorena CookeyGRAVES, Olevia M (MRN [id number]) as of 12/14/2016 10:16  Ref. Range 12/14/2016 09:46  Scan Result Unknown [id number]   I have independently reviewed the films.    Assessment & Plan:    1. Cystocele  - refer to gynecology for evaluation for a pessary fitting.  Return for after pessary fitting.  These notes generated with voice recognition software. I apologize for typographical errors.  Michiel CowboySHANNON Siler Mavis, PA-C  [facility] [address], [address] [place], [place] [address] [phone]

## 2016-12-14 ENCOUNTER — Encounter: Payer: Self-pay | Admitting: Urology

## 2016-12-14 ENCOUNTER — Ambulatory Visit (INDEPENDENT_AMBULATORY_CARE_PROVIDER_SITE_OTHER): Payer: Medicare HMO | Admitting: Urology

## 2016-12-14 VITALS — BP 130/78 | HR 80 | Ht 62.0 in | Wt 173.9 lb

## 2016-12-14 DIAGNOSIS — R32 Unspecified urinary incontinence: Secondary | ICD-10-CM

## 2016-12-14 DIAGNOSIS — N8111 Cystocele, midline: Secondary | ICD-10-CM | POA: Diagnosis not present

## 2016-12-14 LAB — BLADDER SCAN AMB NON-IMAGING: SCAN RESULT: 253

## 2017-01-06 ENCOUNTER — Encounter: Payer: Self-pay | Admitting: Obstetrics and Gynecology

## 2017-01-06 ENCOUNTER — Ambulatory Visit (INDEPENDENT_AMBULATORY_CARE_PROVIDER_SITE_OTHER): Payer: Medicare HMO | Admitting: Obstetrics and Gynecology

## 2017-01-06 VITALS — BP 129/73 | HR 76 | Ht 64.0 in | Wt 174.0 lb

## 2017-01-06 DIAGNOSIS — R300 Dysuria: Secondary | ICD-10-CM

## 2017-01-06 DIAGNOSIS — M545 Low back pain, unspecified: Secondary | ICD-10-CM

## 2017-01-06 DIAGNOSIS — R351 Nocturia: Secondary | ICD-10-CM

## 2017-01-06 DIAGNOSIS — N8110 Cystocele, unspecified: Secondary | ICD-10-CM | POA: Diagnosis not present

## 2017-01-06 DIAGNOSIS — N952 Postmenopausal atrophic vaginitis: Secondary | ICD-10-CM | POA: Diagnosis not present

## 2017-01-06 DIAGNOSIS — R350 Frequency of micturition: Secondary | ICD-10-CM

## 2017-01-06 NOTE — Progress Notes (Signed)
    GYNECOLOGY PROGRESS NOTE  Subjective:    Patient ID: [name], female    DOB: 10-24-1940, 76 y.o.   MRN: [id number]  HPI  Patient is a 76 y.o. G29P3003 female who presents for as a referral from [facility] for pessary fitting.  Patient currently with  cystocele with vaginal pressure, urinary urgency, nocturia, and back pain.  She has had these symptoms for a "long time".  The patient was seen back in January this year in our office by Dr. Brennan Bailey after being referred by her PCP.  At that time, patient notes that was not noticing many symptoms (mostly just the vaginal bulge), but overall wasn't bothered by it. States that she was offered surgery as a possible remedy for her cystocele, however patient declined at that time.  Today she still notes that she would prefer not to have surgery if possible.   Now, patient is noting is having urinary frequency x 4-7, urgency x 4-7, dysuria and nocturia x 4-7. She is also experiencing occasional mild lower back pain. Denies leakage of urine, hematuria, constipation or difficulty moving bowels.   The following portions of the patient's history were reviewed and updated as appropriate:  She  has a past medical history of DVT (deep venous thrombosis) (HCC); GERD (gastroesophageal reflux disease); Hypertension; and PE (pulmonary embolism).   She  has a past surgical history that includes Knee arthroscopy (Right); Cholecystectomy; Abdominal hysterectomy; and Knee surgery (Left).   Her family history includes Breast cancer (age of onset: 21) in her paternal aunt.   She  reports that she has never smoked. She has never used smokeless tobacco. She reports that she does not drink alcohol or use drugs.   She has a current medication list which includes the following prescription(s): acetaminophen, lisinopril, omeprazole, triamterene-hydrochlorothiazide, warfarin, and warfarin.    She is allergic to percocet  [oxycodone-acetaminophen] and sulfa antibiotics..  Review of Systems Pertinent items noted in HPI and remainder of comprehensive ROS otherwise negative.   Objective:   Blood pressure 129/73, pulse 76, height 5\' 4"  (1.626 m), weight 174 lb (78.9 kg). General appearance: alert and no distress Abdomen: soft, non-tender; bowel sounds normal; no masses,  no organomegaly Pelvic: external genitalia normal, rectovaginal septum normal.  Vagina without discharge. Atrophy present.  Grade III cystocele present. No rectocele.  Uterus an cervix surgically absent.  Adnexae non-palpable, nontender bilaterally (patient cannot remember if ovaries present or absent).  Extremities: extremities normal, atraumatic, no cyanosis or edema Neurologic: Grossly normal   Assessment:   Baden-Walker grade 3 cystocele Urinary frequency Vaginal atrophy Bilateral low back pain without sciatica, unspecified chronicity Nocturia Dysuria  Plan:   Discussed cystoceles/rectoceles and management options with the patient. All questions answered. Agricultural engineer distributed. Patient continues to decline surgical intervention at this time. Discussed pessary and pessary fitting performed today.  Will order Size 4 ring pessary with support.  Follow up in 1-2 weeks for pessary insertion.     Hildred Laser, MD [facility]

## 2017-01-19 ENCOUNTER — Ambulatory Visit (INDEPENDENT_AMBULATORY_CARE_PROVIDER_SITE_OTHER): Payer: Medicare HMO | Admitting: Obstetrics and Gynecology

## 2017-01-19 ENCOUNTER — Encounter: Payer: Self-pay | Admitting: Obstetrics and Gynecology

## 2017-01-19 VITALS — BP 146/76 | HR 76 | Ht 64.0 in | Wt 175.1 lb

## 2017-01-19 DIAGNOSIS — N8110 Cystocele, unspecified: Secondary | ICD-10-CM | POA: Diagnosis not present

## 2017-01-19 DIAGNOSIS — M545 Low back pain, unspecified: Secondary | ICD-10-CM

## 2017-01-19 DIAGNOSIS — R350 Frequency of micturition: Secondary | ICD-10-CM

## 2017-01-19 DIAGNOSIS — R351 Nocturia: Secondary | ICD-10-CM

## 2017-01-19 DIAGNOSIS — N952 Postmenopausal atrophic vaginitis: Secondary | ICD-10-CM

## 2017-01-19 DIAGNOSIS — G8929 Other chronic pain: Secondary | ICD-10-CM

## 2017-01-19 NOTE — Progress Notes (Signed)
    GYNECOLOGY PROGRESS NOTE  Subjective:    Patient ID: [name], female    DOB: 05-20-1940, 76 y.o.   MRN: [id number]  HPI  Patient is a 76 y.o. 573P3003 female who presents for pessary insertion.  Patient with Grade III cystocele.  Denies complaints today.   The following portions of the patient's history were reviewed and updated as appropriate: allergies, current medications, past family history, past medical history, past social history, past surgical history and problem list.  Review of Systems Pertinent items noted in HPI and remainder of comprehensive ROS otherwise negative.   Objective:   Blood pressure (!) 146/76, pulse 76, height 5\' 4"  (1.626 m), weight 175 lb 1.6 oz (79.4 kg). General appearance: alert and no distress Abdomen: soft, non-tender; bowel sounds normal; no masses,  no organomegaly Pelvic: external genitalia normal, rectovaginal septum normal.  Vagina without discharge. Atrophy present.  Grade III cystocele present. No rectocele.  Uterus an cervix surgically absent.  Adnexae non-palpable, nontender bilaterally (patient cannot remember if ovaries present or absent).    Assessment:   Baden-Walker grade 3 cystocele Urinary frequency Vaginal atrophy Bilateral low back pain without sciatica, unspecified chronicity Nocturia  Plan:   Size 4 ring pessary with support inserted today. Discussed use of Trimo-san gel weekly.  Return in 2 weeks to reassess symptoms.    [name], Christie Seltzer, MD [facility]

## 2017-01-22 DIAGNOSIS — M545 Low back pain, unspecified: Secondary | ICD-10-CM | POA: Insufficient documentation

## 2017-01-22 DIAGNOSIS — R350 Frequency of micturition: Secondary | ICD-10-CM | POA: Insufficient documentation

## 2017-01-22 DIAGNOSIS — N8110 Cystocele, unspecified: Secondary | ICD-10-CM | POA: Insufficient documentation

## 2017-01-22 DIAGNOSIS — N952 Postmenopausal atrophic vaginitis: Secondary | ICD-10-CM | POA: Insufficient documentation

## 2017-01-22 DIAGNOSIS — G8929 Other chronic pain: Secondary | ICD-10-CM | POA: Insufficient documentation

## 2017-01-22 DIAGNOSIS — R351 Nocturia: Secondary | ICD-10-CM | POA: Insufficient documentation

## 2017-02-03 ENCOUNTER — Encounter: Payer: Self-pay | Admitting: Obstetrics and Gynecology

## 2017-02-03 ENCOUNTER — Ambulatory Visit (INDEPENDENT_AMBULATORY_CARE_PROVIDER_SITE_OTHER): Payer: Medicare HMO | Admitting: Obstetrics and Gynecology

## 2017-02-03 VITALS — BP 124/78 | HR 85 | Ht 64.0 in | Wt 175.7 lb

## 2017-02-03 DIAGNOSIS — N952 Postmenopausal atrophic vaginitis: Secondary | ICD-10-CM

## 2017-02-03 DIAGNOSIS — R350 Frequency of micturition: Secondary | ICD-10-CM | POA: Diagnosis not present

## 2017-02-03 DIAGNOSIS — M545 Low back pain, unspecified: Secondary | ICD-10-CM

## 2017-02-03 DIAGNOSIS — N8110 Cystocele, unspecified: Secondary | ICD-10-CM | POA: Diagnosis not present

## 2017-02-03 DIAGNOSIS — R351 Nocturia: Secondary | ICD-10-CM | POA: Diagnosis not present

## 2017-02-03 NOTE — Progress Notes (Signed)
    GYNECOLOGY PROGRESS NOTE  Subjective:    Patient ID: [name], female    DOB: 1940-11-15, 76 y.o.   MRN: [id number]  HPI  Patient is a 76 y.o. G76P3003 female who presents for 2 week pessary check.  Patient currently with Grade 3 cystocele, nocturia, low back pain and urinary frequency.  Patient states that the pessary fell out ~ 1 day after insertion after trying to void.   The following portions of the patient's history were reviewed and updated as appropriate: allergies, current medications, past family history, past medical history, past social history, past surgical history and problem list.  Review of Systems Pertinent items noted in HPI and remainder of comprehensive ROS otherwise negative.   Objective:   Blood pressure 124/78, pulse 85, height  (1.626 m), weight 175 lb 11.2 oz (79.7 kg). General appearance: alert and no distress Abdomen: soft, non-tender; bowel sounds normal; no masses,  no organomegaly Pelvic: external genitalia normal, rectovaginal septum normal. Vagina without discharge. Atrophy present. Grade III cystocele present. No rectocele. Uterus an cervix surgically absent.Adnexae non-palpable, nontender bilaterally (patient cannot remember if ovaries present or absent).    Assessment:   Baden-Walker grade 3 cystocele Urinary frequency Vaginal atrophy Bilateral low back pain without sciatica, unspecified chronicity Nocturia   Plan:   Pessary refitting performed today.  Will now order Size 2 3/4 Gelhorn pessary (with shortened stem). Reinserted size 4 ring pessary and advised to do occasional digital checks to ensure pessary is in place until new pessary arrives.  Patient notes understanding.    Hildred Laser, MD [facility]

## 2017-02-17 ENCOUNTER — Ambulatory Visit (INDEPENDENT_AMBULATORY_CARE_PROVIDER_SITE_OTHER): Payer: Medicare HMO | Admitting: Obstetrics and Gynecology

## 2017-02-17 ENCOUNTER — Encounter: Payer: Self-pay | Admitting: Obstetrics and Gynecology

## 2017-02-17 VITALS — BP 124/74 | HR 79 | Ht 64.0 in | Wt 175.2 lb

## 2017-02-17 DIAGNOSIS — N8110 Cystocele, unspecified: Secondary | ICD-10-CM | POA: Diagnosis not present

## 2017-02-17 DIAGNOSIS — R350 Frequency of micturition: Secondary | ICD-10-CM

## 2017-02-17 DIAGNOSIS — N952 Postmenopausal atrophic vaginitis: Secondary | ICD-10-CM

## 2017-02-17 DIAGNOSIS — R351 Nocturia: Secondary | ICD-10-CM

## 2017-02-17 NOTE — Progress Notes (Signed)
    GYNECOLOGY PROGRESS NOTE  Subjective:    Patient ID: [name], female    DOB: 1940-07-07, 76 y.o.   MRN: [id number]  HPI  Patient is a 76 y.o. G36P3003 female who presents for new pessary insertion.  Patient with Grade III cystocele. Had a size 4 ring pessary previously that was continuously expelled.  Denies complaints today. Notes that after last visit the pessary fell out again.   The following portions of the patient's history were reviewed and updated as appropriate: allergies, current medications, past family history, past medical history, past social history, past surgical history and problem list.   Review of Systems Pertinent items noted in HPI and remainder of comprehensive ROS otherwise negative.   Objective:   Blood pressure 124/74, pulse 79, height  (1.626 m), weight 175 lb 3.2 oz (79.5 kg). General appearance: alert and no distress Abdomen: soft, non-tender; bowel sounds normal; no masses,  no organomegaly Pelvic: external genitalia normal, rectovaginal septum normal.  Vagina without discharge. Atrophy present.  Grade III cystocele present. No rectocele.  Uterus an cervix surgically absent.  Adnexae non-palpable, nontender bilaterally (patient cannot remember if ovaries present or absent).    Assessment:   Baden-Walker grade 3 cystocele Urinary frequency Vaginal atrophy Nocturia  Plan:   Size 2 3/4 Gellhorn pessary inserted today. Discussed use of Trimo-san gel weekly.  Return in 2 weeks to reassess symptoms.    Hildred Laser, MD [facility]

## 2017-03-03 ENCOUNTER — Encounter: Payer: Self-pay | Admitting: Obstetrics and Gynecology

## 2017-03-03 ENCOUNTER — Ambulatory Visit (INDEPENDENT_AMBULATORY_CARE_PROVIDER_SITE_OTHER): Payer: Medicare HMO | Admitting: Obstetrics and Gynecology

## 2017-03-03 VITALS — BP 116/72 | HR 81 | Ht 64.0 in | Wt 175.9 lb

## 2017-03-03 DIAGNOSIS — Z4689 Encounter for fitting and adjustment of other specified devices: Secondary | ICD-10-CM

## 2017-03-03 DIAGNOSIS — N8110 Cystocele, unspecified: Secondary | ICD-10-CM | POA: Diagnosis not present

## 2017-03-03 DIAGNOSIS — N39492 Postural (urinary) incontinence: Secondary | ICD-10-CM | POA: Diagnosis not present

## 2017-03-03 DIAGNOSIS — N952 Postmenopausal atrophic vaginitis: Secondary | ICD-10-CM

## 2017-03-03 NOTE — Progress Notes (Signed)
    GYNECOLOGY PROGRESS NOTE  Subjective:    Patient ID: Dorena CookeyEarlene M Seat, female    DOB: 05/04/1941, 76 y.o.   MRN: [id number]  HPI  Patient is a 76 y.o. 743P3003 female who presents for 2 week pessary check for Grade 3 cystocele. Patient notes a decrease in her bulge symptoms, however now notes occasional leaking of a small amount of urine (mostly after changing positions, coughing, but sometimes notes on the way to the bathroom).  Otherwise denies urgency. Denies vaginal bleeding, difficulty urinating or moving her bowels.   The following portions of the patient's history were reviewed and updated as appropriate: allergies, current medications, past family history, past medical history, past social history, past surgical history and problem list.  Review of Systems Pertinent items noted in HPI and remainder of comprehensive ROS otherwise negative.   Objective:   Blood pressure 116/72, pulse 81, height 5\' 4"  (1.626 m), weight 175 lb 14.4 oz (79.8 kg). General appearance: alert and no distress Abdomen: soft, non-tender; bowel sounds normal; no masses,  no organomegaly Pelvic: ?External genitalia appeared normal, and intact. The patient's 2 3/4 Gelhorn pessary was removed, cleaned and replaced without complications.Speculum examination revealed mildlly atrophic vaginal mucosa with no lesions or lacerations.    Assessment:   Pessary maintenance Grade 3 cystocele Urinary leakage Vaginal atrophy (mild)  Plan:   - The patient should return in 3 months for a pessary check and continue to use Trimo-San gel as prescribed weekly. - Patient previously declined use estrogen therapy. Declines again today.  - Discussed urinary leakage, likely due to bladder positioning being restored with pessary.  Advised on Kegel exercises, can also send to pelvic floor physical therapy.  Patient declines request for now.      Hildred Laserherry, Derica Leiber, MD [facility] 03/07/2017 12:20 AM

## 2017-03-16 ENCOUNTER — Other Ambulatory Visit: Payer: Self-pay | Admitting: Internal Medicine

## 2017-03-16 DIAGNOSIS — Z1231 Encounter for screening mammogram for malignant neoplasm of breast: Secondary | ICD-10-CM

## 2017-03-23 ENCOUNTER — Ambulatory Visit: Payer: Medicare HMO | Admitting: Obstetrics and Gynecology

## 2017-03-23 VITALS — BP 163/91 | HR 80 | Ht 64.0 in | Wt 176.2 lb

## 2017-03-23 DIAGNOSIS — N939 Abnormal uterine and vaginal bleeding, unspecified: Secondary | ICD-10-CM | POA: Diagnosis not present

## 2017-03-23 DIAGNOSIS — Z960 Presence of urogenital implants: Secondary | ICD-10-CM

## 2017-03-23 DIAGNOSIS — N8110 Cystocele, unspecified: Secondary | ICD-10-CM | POA: Diagnosis not present

## 2017-03-23 DIAGNOSIS — Z9289 Personal history of other medical treatment: Secondary | ICD-10-CM

## 2017-03-23 MED ORDER — ESTROGENS, CONJUGATED 0.625 MG/GM VA CREA: 1.0000 | TOPICAL_CREAM | VAGINAL | 12 refills | Status: DC

## 2017-03-23 NOTE — Progress Notes (Signed)
    GYNECOLOGY PROGRESS NOTE  Subjective:    Patient ID: [name], female    DOB: 1941-02-24, 76 y.o.   MRN: [id number]  HPI  Patient is a 76 y.o. 433P3003 female who presents for 2 complaints of vaginal spotting x 1 episode yesterday. Currently has Grade 3 cystocele being managed with pessary.  New pessary was inserted ~ 2 weeks ago as a change in size was indicated due to patient having episodes of expulsion with prior pessary.   The following portions of the patient's history were reviewed and updated as appropriate: allergies, current medications, past family history, past medical history, past social history, past surgical history and problem list.  Review of Systems Pertinent items noted in HPI and remainder of comprehensive ROS otherwise negative.   Objective:   Blood pressure (!) 163/91, pulse 80, height 5\' 4"  (1.626 m), weight 176 lb 3.2 oz (79.9 kg). General appearance: alert and no distress Pelvic: ?External genitalia appeared normal, and intact. The patient's 2 3/4 Gelhorn pessary was removed.    The pessary was cleaned and replaced without complications. Speculum examination revealed mildlly atrophic vaginal mucosa with a small abrasion noted on left vaginal sidewall. No other lesions noted.   Assessment:   Vaginal spotting Pessary in situ Grade 3 cystocele Vaginal atrophy (mild)  Plan:   - Vaginal spotting due to small abrasion noted on left vaginal sidewall due to pessary. Removed and reinserted pessary. Patient currently only using Trimo-san gel weekly.  Advised on continued use, but will add Premarin 1 gram weekly to regimen to help with vaginal atrophy and decrease risk of further abrasion or laceration to the vaginal wall.   - To return if bleeding continues or becomes heavier.   - To f/u in ~ 6-8 weeks for pessary check as previously scheduled.  Sooner if any other issues.    Hildred Laserherry, Kelin Borum, MD [facility] 03/23/2017 10:25 AM

## 2017-03-24 ENCOUNTER — Encounter: Payer: Self-pay | Admitting: Obstetrics and Gynecology

## 2017-03-29 ENCOUNTER — Telehealth: Payer: Self-pay | Admitting: Obstetrics and Gynecology

## 2017-03-29 NOTE — Telephone Encounter (Signed)
Spoke with pt and she states spotting has stopped. Informed pt to follow Dr. Oretha Milchherry's recommendations during visit about the next step if spotting continues or bleeding becomes heavier. Pt understood and had no additional questions at this time.

## 2017-03-29 NOTE — Telephone Encounter (Signed)
Patient is still having spotting  Please call

## 2017-04-06 ENCOUNTER — Ambulatory Visit
Admission: RE | Admit: 2017-04-06 | Discharge: 2017-04-06 | Disposition: A | Discharge status: Home / Self Care | Payer: Medicare HMO | Source: Ambulatory Visit | Attending: Internal Medicine | Admitting: Internal Medicine

## 2017-04-06 DIAGNOSIS — Z1231 Encounter for screening mammogram for malignant neoplasm of breast: Secondary | ICD-10-CM | POA: Diagnosis present

## 2017-05-26 ENCOUNTER — Encounter: Payer: Self-pay | Admitting: Obstetrics and Gynecology

## 2017-05-26 ENCOUNTER — Ambulatory Visit: Payer: Medicare HMO | Admitting: Obstetrics and Gynecology

## 2017-05-26 VITALS — BP 120/73 | HR 75 | Wt 175.3 lb

## 2017-05-26 DIAGNOSIS — R32 Unspecified urinary incontinence: Secondary | ICD-10-CM

## 2017-05-26 DIAGNOSIS — N8110 Cystocele, unspecified: Secondary | ICD-10-CM | POA: Diagnosis not present

## 2017-05-26 DIAGNOSIS — N952 Postmenopausal atrophic vaginitis: Secondary | ICD-10-CM

## 2017-05-26 DIAGNOSIS — Z4689 Encounter for fitting and adjustment of other specified devices: Secondary | ICD-10-CM | POA: Diagnosis not present

## 2017-05-26 NOTE — Progress Notes (Signed)
    GYNECOLOGY PROGRESS NOTE  Subjective:    Patient ID: [name], female    DOB: [birth year], 77 y.o.   MRN: [id number]  HPI  Patient is a 77 y.o. 683P3003 female who presents for pessary check for Grade 3 cystocele. Patient still notes occasional leaking of a small amount of urine (mostly after changing positions, coughing, but sometimes notes on the way to the bathroom) since pessary use.  Otherwise denies urgency. Also notes some occasional spotting, but no heavy bleeding. Denies difficulty urinating or moving her bowels.    The following portions of the patient's history were reviewed and updated as appropriate: allergies, current medications, past family history, past medical history, past social history, past surgical history and problem list.  Review of Systems Pertinent items noted in HPI and remainder of comprehensive ROS otherwise negative.   Objective:   Blood pressure 120/73, pulse 75, weight 175 lb 4.8 oz (79.5 kg). General appearance: alert and no distress Abdomen: soft, non-tender; bowel sounds normal; no masses,  no organomegaly Pelvic: ?External genitalia appeared normal, and intact. The patient's 2 3/4 Gelhorn pessary was removed, cleaned and replaced without complications. Speculum examination revealed mildlly atrophic vaginal mucosa with small area of abrasion on left vaginal wall. No lesions or lacerations present.     Assessment:   Pessary maintenance Grade 3 cystocele Urinary leakage Vaginal atrophy (mild)  Plan:   - The patient should return in 3 months for a pessary check and continue to use Trimo-San gel as prescribed twice weekly. - Patient previously declined use estrogen therapy. Declines again today. However if abrasion continues to irritate, will strongly recommend.  - Discussed urinary leakage, likely due to bladder positioning being restored with pessary.  Advised on Kegel exercises again. Patient still declines pelvic floor physical therapy.        Hildred Laserherry, Davionne Mastrangelo, MD [facility] 05/26/2017 9:54 AM

## 2017-05-26 NOTE — Progress Notes (Signed)
Pessary check- Patient reports no complaints. She does mention that she has a hemorrhoid that has caused some minimal bleeding.

## 2017-05-27 ENCOUNTER — Encounter: Payer: Self-pay | Admitting: Obstetrics and Gynecology

## 2017-07-26 ENCOUNTER — Other Ambulatory Visit: Payer: Self-pay | Admitting: Obstetrics and Gynecology

## 2017-07-27 NOTE — Telephone Encounter (Signed)
I received a refill request from this patient for Lisinopril.  Can we find out if she has a PCP

## 2017-07-29 NOTE — Telephone Encounter (Signed)
Pt was called she stated that her pcp had refilled it.

## 2017-07-29 NOTE — Telephone Encounter (Signed)
Pt was called and she stated that the medication has already been refilled.

## 2017-08-04 ENCOUNTER — Encounter: Payer: Medicare HMO | Admitting: Obstetrics and Gynecology

## 2017-08-05 ENCOUNTER — Encounter: Payer: Medicare HMO | Admitting: Obstetrics and Gynecology

## 2017-08-10 ENCOUNTER — Encounter: Payer: Self-pay | Admitting: Obstetrics and Gynecology

## 2017-08-10 ENCOUNTER — Ambulatory Visit (INDEPENDENT_AMBULATORY_CARE_PROVIDER_SITE_OTHER): Payer: Medicare HMO | Admitting: Obstetrics and Gynecology

## 2017-08-10 VITALS — BP 148/74 | HR 83 | Wt 175.1 lb

## 2017-08-10 DIAGNOSIS — N952 Postmenopausal atrophic vaginitis: Secondary | ICD-10-CM | POA: Diagnosis not present

## 2017-08-10 DIAGNOSIS — S3769XA Other injury of uterus, initial encounter: Secondary | ICD-10-CM

## 2017-08-10 DIAGNOSIS — N8110 Cystocele, unspecified: Secondary | ICD-10-CM | POA: Diagnosis not present

## 2017-08-10 DIAGNOSIS — N939 Abnormal uterine and vaginal bleeding, unspecified: Secondary | ICD-10-CM

## 2017-08-10 DIAGNOSIS — Z4689 Encounter for fitting and adjustment of other specified devices: Secondary | ICD-10-CM | POA: Diagnosis not present

## 2017-08-10 NOTE — Progress Notes (Signed)
Pt stated that she has been bleeding since she got the pessary.

## 2017-08-10 NOTE — Progress Notes (Signed)
    GYNECOLOGY PROGRESS NOTE  Subjective:    Patient ID: [name], female    DOB: 09/01/1940, 77 y.o.   MRN: [id number]  HPI  Patient is a [age] y.o. 673P3003 female who presents for pessary check for Grade 3 cystocele. Patient still notes occasional leaking of a small amount of urine (mostly after changing positions, coughing, but sometimes notes on the way to the bathroom) since pessary use.  Otherwise denies urgency. Also notes some occasional spotting, but no heavy bleeding. Denies difficulty urinating or moving her bowels.    The following portions of the patient's history were reviewed and updated as appropriate: allergies, current medications, past family history, past medical history, past social history, past surgical history and problem list.  Review of Systems Pertinent items noted in HPI and remainder of comprehensive ROS otherwise negative.   Objective:   Blood pressure (!) 148/74, pulse 83, weight 175 lb 1.6 oz (79.4 kg). General appearance: alert and no distress Abdomen: soft, non-tender; bowel sounds normal; no masses,  no organomegaly Pelvic: ?External genitalia appeared normal, and intact. The patient's 2 3/4 Gelhorn pessary was removed, cleaned and replaced without complications. Speculum examination revealed mildlly atrophic vaginal mucosa with small area of abrasion on left vaginal wall. No lesions or lacerations present.     Assessment:   Pessary maintenance Grade 3 cystocele Vaginal atrophy (mild)  Plan:   - The patient should return in 3 months for a pessary check.   - Patient used trial of premarin cream last visit but nothing since then.  Advised on use of Premarin cream twice weekly (previously used once weekly) , increasing to 1 gram (previously 0.5 grams) for vaginal atrophy and cervical abrasion from pessary use to help with vaginal spotting.  Advised to discontinue Trimo-San gel at this time.      Hildred Laserherry, Joannah Gitlin, MD [facility]

## 2017-08-10 NOTE — Patient Instructions (Signed)
INCREASE PREMARIN USE TO 1 GRAM TWICE WEEKLY (SUNDAY/WEDNESDAY)

## 2017-11-03 ENCOUNTER — Encounter: Payer: Medicare HMO | Admitting: Obstetrics and Gynecology

## 2017-11-16 ENCOUNTER — Ambulatory Visit: Payer: Medicare HMO | Admitting: Obstetrics and Gynecology

## 2017-11-16 ENCOUNTER — Encounter: Payer: Self-pay | Admitting: Obstetrics and Gynecology

## 2017-11-16 VITALS — BP 121/70 | HR 84 | Ht 64.0 in | Wt 175.5 lb

## 2017-11-16 DIAGNOSIS — Z7901 Long term (current) use of anticoagulants: Secondary | ICD-10-CM

## 2017-11-16 DIAGNOSIS — N939 Abnormal uterine and vaginal bleeding, unspecified: Secondary | ICD-10-CM

## 2017-11-16 DIAGNOSIS — N8110 Cystocele, unspecified: Secondary | ICD-10-CM

## 2017-11-16 DIAGNOSIS — R1013 Epigastric pain: Secondary | ICD-10-CM

## 2017-11-16 DIAGNOSIS — R399 Unspecified symptoms and signs involving the genitourinary system: Secondary | ICD-10-CM

## 2017-11-16 DIAGNOSIS — Z4689 Encounter for fitting and adjustment of other specified devices: Secondary | ICD-10-CM

## 2017-11-16 DIAGNOSIS — N952 Postmenopausal atrophic vaginitis: Secondary | ICD-10-CM

## 2017-11-16 LAB — POCT URINALYSIS DIPSTICK
Bilirubin, UA: NEGATIVE
Glucose, UA: NEGATIVE
Ketones, UA: NEGATIVE
Nitrite, UA: NEGATIVE
Protein, UA: POSITIVE — AB
SPEC GRAV UA: 1.02 (ref 1.010–1.025)
UROBILINOGEN UA: 0.2 U/dL
pH, UA: 6 (ref 5.0–8.0)

## 2017-11-16 NOTE — Progress Notes (Signed)
Pt present today for pessary check. Pt stated still having bloody spots. Pt stated that she is having pain in the stomach area.

## 2017-11-16 NOTE — Progress Notes (Addendum)
    GYNECOLOGY PROGRESS NOTE  Subjective:    Patient ID: [name], female    DOB: 1940/09/14, 77 y.o.   MRN: [id number]  HPI  Patient is a 77 y.o. 683P3003 female who presents for pessary check for Grade 3 cystocele.   Notes occasional mild dysuria, but denies urgency. Also continues to note some occasional spotting, but no heavy bleeding. Denies difficulty urinating or moving her bowels.    The following portions of the patient's history were reviewed and updated as appropriate: allergies, current medications, past family history, past medical history, past social history, past surgical history and problem list.  Review of Systems A comprehensive review of systems was negative except for: Gastrointestinal: positive for abdominal pain.  Notes that it is intermittent, sometimes aggravated by certain foods and other times not. Pain feels dull and achy and is located in mid-abdomen. Denies nausea/vomiting, fevers, chills, constipation, or diarrhea.  Reports last colonoscopy was normal in 2015, but does have a remote h/o polyps. Reports concern as her daughter has been diagnosed with pancreatitis in the past, wonders if she could be experiencing this.   Objective:   Blood pressure 121/70, pulse 84, height 5\' 4"  (1.626 m), weight 175 lb 8 oz (79.6 kg). General appearance: alert and no distress Abdomen: soft, non-tender; bowel sounds normal; no masses,  no organomegaly Pelvic: ?External genitalia appeared normal, and intact. The patient's 2 3/4 Gelhorn pessary was removed, cleaned and replaced without complications. Speculum examination revealed no lesions or lacerations present, however scant dark red blood in vaginal vault.      Labs:  Results for orders placed or performed in visit on 11/16/17  POCT urinalysis dipstick  Result Value Ref Range   Color, UA yellow    Clarity, UA clear    Glucose, UA Negative Negative   Bilirubin, UA neg    Ketones, UA neg    Spec Grav, UA 1.020 1.010  - 1.025   Blood, UA 3+    pH, UA 6.0 5.0 - 8.0   Protein, UA Positive (A) Negative   Urobilinogen, UA 0.2 0.2 or 1.0 E.U./dL   Nitrite, UA neg    Leukocytes, UA Moderate (2+) (A) Negative   Appearance yellow    Odor       Assessment:   Pessary maintenance Grade 3 cystocele Vaginal atrophy (mild) Vaginal spotting On anti-coagulation therapy Abdominal pain Dysuria  Plan:   - The patient should return in 10-12 weeks for a pessary check.   - Is currently using her estrogen cream twice weekly, 1 gram for her vaginal atrophy.  Patient with normal exam today, no evidence of lesions, ulcerations or abrasions.  Discussed that with her use of anti-coagulants, she may be more prone to sporadic episodes of light bleeding with pessary use. She has been worked up recently for PMB with negative findings.  - Abdominal pain/epigastric pain.  Attempted to give reassurance that patient likely does not have pancreatitis, however patient would like to have labs done to check. Will order CMP, and if abnormal proceed with further workup.  She is currently afebrile and without pain. If labs normal, advised to reach out again to her PCP. Advised on monitoring diet (as patient does admit to sometimes eating foods she shouldn't, especially green leafy vegetables while on anticoagulation).   - UA with blood (however could also be vaginal), moderate leukocytes. Nitrite negative but will send for a culture.   Hildred Laserherry, Tonishia Steffy, MD [facility]

## 2017-11-16 NOTE — Addendum Note (Signed)
Addended by: Silvano BilisHAMPTON, Samari Gorby L on: 11/16/2017 04:40 PM   Modules accepted: Orders

## 2017-11-16 NOTE — Addendum Note (Signed)
Addended by: Silvano BilisHAMPTON, Neco Kling L on: 11/16/2017 04:04 PM   Modules accepted: Orders

## 2017-11-17 LAB — COMPREHENSIVE METABOLIC PANEL
ALBUMIN: 3.8 g/dL (ref 3.5–4.8)
ALT: 16 IU/L (ref 0–32)
AST: 23 IU/L (ref 0–40)
Albumin/Globulin Ratio: 1.1 — ABNORMAL LOW (ref 1.2–2.2)
Alkaline Phosphatase: 46 IU/L (ref 39–117)
BUN / CREAT RATIO: 21 (ref 12–28)
BUN: 26 mg/dL (ref 8–27)
Bilirubin Total: 0.3 mg/dL (ref 0.0–1.2)
CALCIUM: 9.6 mg/dL (ref 8.7–10.3)
CHLORIDE: 102 mmol/L (ref 96–106)
CO2: 24 mmol/L (ref 20–29)
CREATININE: 1.25 mg/dL — AB (ref 0.57–1.00)
GFR, EST AFRICAN AMERICAN: 48 mL/min/{1.73_m2} — AB (ref >59)
GFR, EST NON AFRICAN AMERICAN: 41 mL/min/{1.73_m2} — AB (ref >59)
GLUCOSE: 112 mg/dL — AB (ref 65–99)
Globulin, Total: 3.5 g/dL (ref 1.5–4.5)
Potassium: 3.7 mmol/L (ref 3.5–5.2)
Sodium: 140 mmol/L (ref 134–144)
TOTAL PROTEIN: 7.3 g/dL (ref 6.0–8.5)

## 2017-11-20 LAB — URINE CULTURE

## 2017-11-22 ENCOUNTER — Other Ambulatory Visit: Payer: Self-pay

## 2017-11-22 MED ORDER — CIPROFLOXACIN HCL 250 MG PO TABS: 250.0000 mg | ORAL_TABLET | Freq: Two times a day (BID) | ORAL | 0 refills | Status: DC

## 2017-12-08 ENCOUNTER — Telehealth: Payer: Self-pay | Admitting: Obstetrics and Gynecology

## 2017-12-08 MED ORDER — ESTROGENS, CONJUGATED 0.625 MG/GM VA CREA: 1.0000 | TOPICAL_CREAM | VAGINAL | 2 refills | Status: DC

## 2017-12-08 NOTE — Telephone Encounter (Signed)
The patient called requesting a refill on her Premarin.  She states she is using it twice weekly rather than once weekly so she is out. She also called the pharmacy for a refill and they stated she had to get DR approval first.  Please advise, thanks.

## 2017-12-08 NOTE — Telephone Encounter (Signed)
Pt was called and informed that her medication was refilled. 

## 2017-12-08 NOTE — Telephone Encounter (Signed)
The patient called the pharmacy and stated the Premarin was not sent in yet.  She is asking for a call back, please advise, thanks.

## 2017-12-14 ENCOUNTER — Telehealth: Payer: Self-pay | Admitting: Obstetrics and Gynecology

## 2017-12-14 NOTE — Telephone Encounter (Signed)
Pt was called and was informed that she could come by the office to pick up a sample of the medication until her insurance will fill her prescription.

## 2017-12-14 NOTE — Telephone Encounter (Signed)
The patient called and stated that she is unable to get her premarin cream from her pharmacy, They informed her that her insurance will not cover the medication until the 11th. The patient would like to know if she can have some samples or if she will be okay without it until that date?, Please advise.

## 2018-01-18 ENCOUNTER — Encounter: Payer: Self-pay | Admitting: Obstetrics and Gynecology

## 2018-01-18 ENCOUNTER — Ambulatory Visit: Payer: Medicare HMO | Admitting: Obstetrics and Gynecology

## 2018-01-18 VITALS — BP 110/72 | HR 76 | Ht 64.0 in | Wt 174.8 lb

## 2018-01-18 DIAGNOSIS — Z4689 Encounter for fitting and adjustment of other specified devices: Secondary | ICD-10-CM

## 2018-01-18 DIAGNOSIS — K5900 Constipation, unspecified: Secondary | ICD-10-CM

## 2018-01-18 DIAGNOSIS — N8110 Cystocele, unspecified: Secondary | ICD-10-CM | POA: Diagnosis not present

## 2018-01-18 DIAGNOSIS — N952 Postmenopausal atrophic vaginitis: Secondary | ICD-10-CM | POA: Diagnosis not present

## 2018-01-18 DIAGNOSIS — K640 First degree hemorrhoids: Secondary | ICD-10-CM | POA: Diagnosis not present

## 2018-01-18 MED ORDER — ESTROGENS, CONJUGATED 0.625 MG/GM VA CREA: 1.0000 | TOPICAL_CREAM | VAGINAL | 4 refills | Status: DC

## 2018-01-18 NOTE — Progress Notes (Signed)
    GYNECOLOGY PROGRESS NOTE  Subjective:    Patient ID: [name], female    DOB: 1941-02-18, 77 y.o.   MRN: [id number]  HPI  Patient is a 77 y.o. G65P3003 female who presents for pessary check for Grade 3 cystocele. Patient reports small amount of pink blood when wiping, but unsure if it is from the rectum or the vagina as she has also noted being slightly constipated lately. Reports just recently initiating drinking prune juice to help with her constipation. Denies difficulty urinating.    The following portions of the patient's history were reviewed and updated as appropriate: allergies, current medications, past family history, past medical history, past social history, past surgical history and problem list.  Review of Systems Pertinent items noted in HPI and remainder of comprehensive ROS otherwise negative.   Objective:   Blood pressure 110/72, pulse 76, height 5\' 4"  (1.626 m), weight 174 lb 12.8 oz (79.3 kg). General appearance: alert and no distress Abdomen: soft, non-tender; bowel sounds normal; no masses,  no organomegaly Pelvic: ?External genitalia appeared normal, and intact. The patient's 2 3/4 Gelhorn pessary was removed, cleaned and replaced without complications. Speculum examination revealed mildlly atrophic vaginal mucosa with scant amount of pink blood. No lesions or lacerations present.  Rectal: small residual hemorrhoid present     Assessment:   Pessary maintenance Grade 3 cystocele Vaginal atrophy (mild) Constipation Hemorrhoid  Plan:   - The patient should return in 3 months for a pessary check.   - Discussed management of hemorrhoids with OTC Preparation H and aid with constipation with prune juice/apple juice.  - Patient reports that she has run out of Premarin cream and that her pharmacy will not fill again for 3 months due to the way prescription was written. Submitted new prescription to patient's pharmacy and will give samples in the meantime.  Continue to use twice weekly.     Hildred Laser, MD [facility]

## 2018-01-18 NOTE — Progress Notes (Signed)
PT is present today for a pessary check.  Pt stated that she is doing well other than still bleeding from her anal area. Pt stated at time that she is not sure if the bleeding is coming from the anal area or the vaginal area. No other complaints.

## 2018-04-13 ENCOUNTER — Ambulatory Visit: Payer: Medicare HMO | Admitting: Gastroenterology

## 2018-04-19 ENCOUNTER — Ambulatory Visit: Payer: Medicare HMO | Admitting: Obstetrics and Gynecology

## 2018-04-19 ENCOUNTER — Encounter: Payer: Self-pay | Admitting: Obstetrics and Gynecology

## 2018-04-19 ENCOUNTER — Telehealth: Payer: Self-pay

## 2018-04-19 VITALS — BP 118/72 | HR 87 | Ht 64.0 in | Wt 175.0 lb

## 2018-04-19 DIAGNOSIS — Z4689 Encounter for fitting and adjustment of other specified devices: Secondary | ICD-10-CM | POA: Diagnosis not present

## 2018-04-19 DIAGNOSIS — N939 Abnormal uterine and vaginal bleeding, unspecified: Secondary | ICD-10-CM

## 2018-04-19 DIAGNOSIS — N952 Postmenopausal atrophic vaginitis: Secondary | ICD-10-CM | POA: Diagnosis not present

## 2018-04-19 DIAGNOSIS — K5900 Constipation, unspecified: Secondary | ICD-10-CM

## 2018-04-19 DIAGNOSIS — N8110 Cystocele, unspecified: Secondary | ICD-10-CM | POA: Diagnosis not present

## 2018-04-19 DIAGNOSIS — S30814A Abrasion of vagina and vulva, initial encounter: Secondary | ICD-10-CM

## 2018-04-19 NOTE — Progress Notes (Signed)
Pt stated that she is still spotting(bleeding) a little. Pt stated that the bleeding is off and on. No other complaints.

## 2018-04-19 NOTE — Progress Notes (Signed)
    GYNECOLOGY PROGRESS NOTE  Subjective:    Patient ID: Christie CookeyEarlene M Souffrant, female    DOB: [birth year], 77 y.o.   MRN: [id number]  HPI  Patient is a 77 y.o. 403P3003 female who presents for pessary check for Grade 3 cystocele. Patient still reporting an occasional episode of small amount of pink blood when wiping. Denies urinary complaints. Does note still having to sometimes strain with bowel movements.  Is drinking prune juice to help with her constipation. Still noting difficulties getting refill from her pharmacy (states that her current tube says no more refills until July of next year and was not able to get from her pharmacy).   The following portions of the patient's history were reviewed and updated as appropriate: allergies, current medications, past family history, past medical history, past social history, past surgical history and problem list.  Review of Systems Pertinent items noted in HPI and remainder of comprehensive ROS otherwise negative.   Objective:   Blood pressure 118/72, pulse 87, height 5\' 4"  (1.626 m), weight 175 lb (79.4 kg). General appearance: alert and no distress Abdomen: soft, non-tender; bowel sounds normal; no masses,  no organomegaly Pelvic: ?External genitalia appeared normal, and intact. The patient's 2 3/4 Gelhorn pessary was removed, cleaned and replaced without complications. Speculum examination revealed mildlly atrophic vaginal mucosa with scant amount of pink blood. Mild abrasion of the left vaginal sidewall present.     Assessment:   Pessary maintenance Vaginal abrasion Vaginal spotting Grade 3 cystocele Vaginal atrophy (mild) Constipation   Plan:   - The patient should return in 3 months for a pessary check.   - Continue management of constipation with prune juice/apple juice. Can prescribe stool softeners if needed.  - Will contact patient's pharmacy regarding her Premarin cream prescription. Continue to use twice weekly.  Given samples.      Hildred Laserherry, Shaya Reddick, MD [facility]

## 2018-04-19 NOTE — Telephone Encounter (Signed)
pharmacy was called to see why the pt only had one refill remaining [facility]. Pharmcy explained that the pt has to be using too much of the cream.

## 2018-07-19 ENCOUNTER — Ambulatory Visit: Payer: Medicare HMO | Admitting: Obstetrics and Gynecology

## 2018-07-19 ENCOUNTER — Encounter: Payer: Self-pay | Admitting: Obstetrics and Gynecology

## 2018-07-19 VITALS — BP 118/70 | HR 89 | Ht 64.0 in | Wt 172.7 lb

## 2018-07-19 DIAGNOSIS — N939 Abnormal uterine and vaginal bleeding, unspecified: Secondary | ICD-10-CM

## 2018-07-19 DIAGNOSIS — N952 Postmenopausal atrophic vaginitis: Secondary | ICD-10-CM | POA: Diagnosis not present

## 2018-07-19 DIAGNOSIS — Z4689 Encounter for fitting and adjustment of other specified devices: Secondary | ICD-10-CM | POA: Diagnosis not present

## 2018-07-19 DIAGNOSIS — N8110 Cystocele, unspecified: Secondary | ICD-10-CM | POA: Diagnosis not present

## 2018-07-19 NOTE — Progress Notes (Signed)
Pt is here for pessary check. Pt stated that this morning when she had a BM this morning she noticed blood, but it was from the vaginal area not the rectum. Pt stated that it was light blood.

## 2018-07-19 NOTE — Progress Notes (Signed)
    GYNECOLOGY PROGRESS NOTE  Subjective:    Patient ID: [name], female    DOB: [birth year], 78 y.o.   MRN: [id number]  HPI  Patient is a 78 y.o. G68P3003 female who presents for pessary check for Grade 3 cystocele. Patient still noting occasional spotting. Has been worked up in the past for PMB which was negative. Denies urinary complaints. Notes bowel movements are better since initiating prune juice regimen.  The following portions of the patient's history were reviewed and updated as appropriate: allergies, current medications, past family history, past medical history, past social history, past surgical history and problem list.   Review of Systems Pertinent items noted in HPI and remainder of comprehensive ROS otherwise negative.   Objective:   Blood pressure 118/70, pulse 89, height 5\' 4"  (1.626 m), weight 172 lb 11.2 oz (78.3 kg). General appearance: alert and no distress Abdomen: soft, non-tender; bowel sounds normal; no masses,  no organomegaly Pelvic: ?External genitalia appeared normal, and intact. The patient's 2 3/4 Gelhorn pessary was removed, cleaned and replaced without complications. Speculum examination revealed mildlly atrophic vaginal mucosa. Mild abrasion of the left vaginal sidewall present.     Assessment:   Pessary maintenance Vaginal abrasion Vaginal spotting Grade 3 cystocele Vaginal atrophy (mild)    Plan:   - The patient should return in 3 months for a pessary check.   - Continue use of Premarin cream prescription. Continue to use twice weekly.     Hildred Laser, MD [facility]

## 2018-10-18 ENCOUNTER — Telehealth: Payer: Self-pay

## 2018-10-18 NOTE — Telephone Encounter (Signed)
Pt prescreened no symptoms pt has face mask.   Coronavirus (COVID-19) Are you at risk?  Are you at risk for the Coronavirus (COVID-19)?  To be considered HIGH RISK for Coronavirus (COVID-19), you have to meet the following criteria:  . Traveled to [place], [place], [place], [place] or [place]; or in the [place] to [place], [place], [place], or [place]; and have fever, cough, and shortness of breath within the last 2 weeks of travel OR . Been in close contact with a person diagnosed with COVID-19 within the last 2 weeks and have fever, cough, and shortness of breath . IF YOU DO NOT MEET THESE CRITERIA, YOU ARE CONSIDERED LOW RISK FOR COVID-19.  What to do if you are HIGH RISK for COVID-19?  . If you are having a medical emergency, call 911. . Seek medical care right away. Before you go to a doctor's office, urgent care or emergency department, call ahead and tell them about your recent travel, contact with someone diagnosed with COVID-19, and your symptoms. You should receive instructions from your physician's office regarding next steps of care.  . When you arrive at healthcare provider, tell the healthcare staff immediately you have returned from visiting [place], [place], [place], [place] or [place]; or traveled in the [place] to [place], [place], [place], or [place]; in the last two weeks or you have been in close contact with a person diagnosed with COVID-19 in the last 2 weeks.   . Tell the health care staff about your symptoms: fever, cough and shortness of breath. . After you have been seen by a medical provider, you will be either: o Tested for (COVID-19) and discharged home on quarantine except to seek medical care if symptoms worsen, and asked to  - Stay home and avoid contact with others until you get your results (4-5 days)  - Avoid travel on public transportation if possible (such as bus, train, or airplane) or o Sent to the Emergency Department by EMS for  evaluation, COVID-19 testing, and possible admission depending on your condition and test results.  What to do if you are LOW RISK for COVID-19?  Reduce your risk of any infection by using the same precautions used for avoiding the common cold or flu:  . Wash your hands often with soap and warm water for at least 20 seconds.  If soap and water are not readily available, use an alcohol-based hand sanitizer with at least 60% alcohol.  . If coughing or sneezing, cover your mouth and nose by coughing or sneezing into the elbow areas of your shirt or coat, into a tissue or into your sleeve (not your hands). . Avoid shaking hands with others and consider head nods or verbal greetings only. . Avoid touching your eyes, nose, or mouth with unwashed hands.  . Avoid close contact with people who are sick. . Avoid places or events with large numbers of people in one location, like concerts or sporting events. . Carefully consider travel plans you have or are making. . If you are planning any travel outside or inside the [place], visit the CDC's Travelers' Health webpage for the latest health notices. . If you have some symptoms but not all symptoms, continue to monitor at home and seek medical attention if your symptoms worsen. . If you are having a medical emergency, call 911.   ADDITIONAL HEALTHCARE OPTIONS FOR PATIENTS  [facility] / e-Visit: [email or web address]           [facility]: [phone]  [facility]: [phone]                   [facility]: [phone]  

## 2018-10-19 ENCOUNTER — Ambulatory Visit (INDEPENDENT_AMBULATORY_CARE_PROVIDER_SITE_OTHER): Payer: Medicare HMO | Admitting: Obstetrics and Gynecology

## 2018-10-19 ENCOUNTER — Encounter: Payer: Self-pay | Admitting: Obstetrics and Gynecology

## 2018-10-19 ENCOUNTER — Other Ambulatory Visit: Payer: Self-pay

## 2018-10-19 VITALS — BP 133/74 | HR 72 | Ht 64.0 in | Wt 162.9 lb

## 2018-10-19 DIAGNOSIS — Z4689 Encounter for fitting and adjustment of other specified devices: Secondary | ICD-10-CM

## 2018-10-19 DIAGNOSIS — N952 Postmenopausal atrophic vaginitis: Secondary | ICD-10-CM

## 2018-10-19 DIAGNOSIS — R399 Unspecified symptoms and signs involving the genitourinary system: Secondary | ICD-10-CM | POA: Diagnosis not present

## 2018-10-19 DIAGNOSIS — N8110 Cystocele, unspecified: Secondary | ICD-10-CM | POA: Diagnosis not present

## 2018-10-19 DIAGNOSIS — N939 Abnormal uterine and vaginal bleeding, unspecified: Secondary | ICD-10-CM

## 2018-10-19 LAB — POCT URINALYSIS DIPSTICK
Bilirubin, UA: NEGATIVE
Glucose, UA: NEGATIVE
Ketones, UA: NEGATIVE
Nitrite, UA: NEGATIVE
Protein, UA: NEGATIVE
Spec Grav, UA: 1.015 (ref 1.010–1.025)
Urobilinogen, UA: 0.2 E.U./dL
pH, UA: 6.5 (ref 5.0–8.0)

## 2018-10-19 MED ORDER — NITROFURANTOIN MONOHYD MACRO 100 MG PO CAPS: 100.0000 mg | ORAL_CAPSULE | Freq: Two times a day (BID) | ORAL | 1 refills | Status: DC

## 2018-10-19 NOTE — Progress Notes (Signed)
Pt is present today for pessary check. Pt stated that she noticed light spotting of bleed off and on with the pessary.

## 2018-10-19 NOTE — Progress Notes (Signed)
    GYNECOLOGY PROGRESS NOTE  Subjective:    Patient ID: [name], female    DOB: Oct 04, 1940, 78 y.o.   MRN: [id number]  HPI  Patient is a 78 y.o. G22P3003 female who presents for pessary check for Grade 3 cystocele. Patient notes an episode of spotting yesterday.   She also thinks that she may have a UTI as she is noting some irritative voiding symptoms. Notes that she uses her Premarin cream (but sometimes forgets).   The following portions of the patient's history were reviewed and updated as appropriate: allergies, current medications, past family history, past medical history, past social history, past surgical history and problem list.   Review of Systems Pertinent items noted in HPI and remainder of comprehensive ROS otherwise negative.   Objective:   Blood pressure 133/74, pulse 72, height 5\' 4"  (1.626 m), weight 162 lb 14.4 oz (73.9 kg). General appearance: alert and no distress Abdomen: soft, non-tender; bowel sounds normal; no masses,  no organomegaly Pelvic: ?External genitalia appeared normal, and intact. The patient's 2 3/4 Gelhorn pessary was removed, cleaned and replaced without complications. Speculum examination revealed mildlly atrophic vaginal mucosa. Mildly denuded vaginal tissue at vaginal cuff.    Labs:  Results for orders placed or performed in visit on 10/19/18  POCT urinalysis dipstick  Result Value Ref Range   Color, UA yellow    Clarity, UA clear    Glucose, UA Negative Negative   Bilirubin, UA neg    Ketones, UA neg    Spec Grav, UA 1.015 1.010 - 1.025   Blood, UA large    pH, UA 6.5 5.0 - 8.0   Protein, UA Negative Negative   Urobilinogen, UA 0.2 0.2 or 1.0 E.U./dL   Nitrite, UA neg    Leukocytes, UA Moderate (2+) (A) Negative   Appearance yellow    Odor       Assessment:   Pessary maintenance  Vaginal spotting Grade 3 cystocele Vaginal atrophy (mild) UTI symptoms  Plan:    - Continue use of Premarin cream prescription.  Continue to use twice weekly.  Stressed importance to help keeping vaginal spotting/bleeding to a minimum.  - Patient with UTI symptoms, blood and leukocytes in urine. Will order urine culture but can start on Macrobid prophylactically for symptoms.  - The patient should return in 3 months for a pessary check.      Hildred Laser, MD [facility]

## 2018-10-21 LAB — URINE CULTURE

## 2018-10-25 ENCOUNTER — Other Ambulatory Visit: Payer: Self-pay | Admitting: Otolaryngology

## 2018-10-25 DIAGNOSIS — R42 Dizziness and giddiness: Secondary | ICD-10-CM

## 2018-11-04 ENCOUNTER — Ambulatory Visit
Admission: RE | Admit: 2018-11-04 | Discharge: 2018-11-04 | Disposition: A | Discharge status: Home / Self Care | Payer: Medicare HMO | Source: Ambulatory Visit | Attending: Otolaryngology | Admitting: Otolaryngology

## 2018-11-04 ENCOUNTER — Other Ambulatory Visit: Payer: Self-pay

## 2018-11-04 DIAGNOSIS — R42 Dizziness and giddiness: Secondary | ICD-10-CM | POA: Insufficient documentation

## 2018-11-04 LAB — POCT I-STAT CREATININE: Creatinine, Ser: 1.2 mg/dL — ABNORMAL HIGH (ref 0.44–1.00)

## 2018-11-04 MED ORDER — GADOBUTROL 1 MMOL/ML IV SOLN
7.0000 mL | Freq: Once | INTRAVENOUS | Status: AC | PRN
  Administered 2018-11-04: 7 mL via INTRAVENOUS

## 2018-11-29 ENCOUNTER — Telehealth: Payer: Self-pay | Admitting: *Deleted

## 2018-11-29 ENCOUNTER — Other Ambulatory Visit: Payer: Medicare HMO

## 2018-11-29 DIAGNOSIS — Z20822 Contact with and (suspected) exposure to covid-19: Secondary | ICD-10-CM

## 2018-11-29 NOTE — Telephone Encounter (Signed)
[facility] office calling to request testing for patient in office  Phone:[phone]  Symptomatic

## 2018-12-03 LAB — NOVEL CORONAVIRUS, NAA: SARS-CoV-2, NAA: NOT DETECTED

## 2018-12-12 ENCOUNTER — Encounter: Payer: Self-pay | Admitting: *Deleted

## 2018-12-12 ENCOUNTER — Other Ambulatory Visit: Payer: Self-pay

## 2018-12-12 ENCOUNTER — Ambulatory Visit (INDEPENDENT_AMBULATORY_CARE_PROVIDER_SITE_OTHER)
Admission: EM | Admit: 2018-12-12 | Discharge: 2018-12-12 | Disposition: A | Discharge status: Other Acute Inpatient Hospital | Payer: Medicare HMO | Source: Home / Self Care | Attending: Family Medicine | Admitting: Family Medicine

## 2018-12-12 ENCOUNTER — Emergency Department
Admission: EM | Admit: 2018-12-12 | Discharge: 2018-12-12 | Disposition: A | Discharge status: Home / Self Care | Payer: Medicare HMO | Attending: Emergency Medicine | Admitting: Emergency Medicine

## 2018-12-12 DIAGNOSIS — Z79899 Other long term (current) drug therapy: Secondary | ICD-10-CM | POA: Diagnosis not present

## 2018-12-12 DIAGNOSIS — E860 Dehydration: Secondary | ICD-10-CM | POA: Insufficient documentation

## 2018-12-12 DIAGNOSIS — E871 Hypo-osmolality and hyponatremia: Secondary | ICD-10-CM | POA: Diagnosis not present

## 2018-12-12 DIAGNOSIS — R531 Weakness: Secondary | ICD-10-CM | POA: Insufficient documentation

## 2018-12-12 DIAGNOSIS — I10 Essential (primary) hypertension: Secondary | ICD-10-CM | POA: Insufficient documentation

## 2018-12-12 DIAGNOSIS — N390 Urinary tract infection, site not specified: Secondary | ICD-10-CM | POA: Insufficient documentation

## 2018-12-12 DIAGNOSIS — N179 Acute kidney failure, unspecified: Secondary | ICD-10-CM

## 2018-12-12 DIAGNOSIS — Z7901 Long term (current) use of anticoagulants: Secondary | ICD-10-CM | POA: Diagnosis not present

## 2018-12-12 LAB — COMPREHENSIVE METABOLIC PANEL
ALT: 76 U/L — ABNORMAL HIGH (ref 0–44)
AST: 82 U/L — ABNORMAL HIGH (ref 15–41)
Albumin: 2.8 g/dL — ABNORMAL LOW (ref 3.5–5.0)
Alkaline Phosphatase: 101 U/L (ref 38–126)
Anion gap: 9 (ref 5–15)
BUN: 28 mg/dL — ABNORMAL HIGH (ref 8–23)
CO2: 23 mmol/L (ref 22–32)
Calcium: 9.1 mg/dL (ref 8.9–10.3)
Chloride: 95 mmol/L — ABNORMAL LOW (ref 98–111)
Creatinine, Ser: 1.62 mg/dL — ABNORMAL HIGH (ref 0.44–1.00)
GFR calc Af Amer: 35 mL/min — ABNORMAL LOW (ref >60)
GFR calc non Af Amer: 30 mL/min — ABNORMAL LOW (ref >60)
Glucose, Bld: 117 mg/dL — ABNORMAL HIGH (ref 70–99)
Potassium: 3.6 mmol/L (ref 3.5–5.1)
Sodium: 127 mmol/L — ABNORMAL LOW (ref 135–145)
Total Bilirubin: 0.3 mg/dL (ref 0.3–1.2)
Total Protein: 7.3 g/dL (ref 6.5–8.1)

## 2018-12-12 LAB — CBC WITH DIFFERENTIAL/PLATELET
Abs Immature Granulocytes: 0.04 10*3/uL (ref 0.00–0.07)
Basophils Absolute: 0 10*3/uL (ref 0.0–0.1)
Basophils Relative: 0 %
Eosinophils Absolute: 0.1 10*3/uL (ref 0.0–0.5)
Eosinophils Relative: 1 %
HCT: 37.2 % (ref 36.0–46.0)
Hemoglobin: 12.5 g/dL (ref 12.0–15.0)
Immature Granulocytes: 1 %
Lymphocytes Relative: 19 %
Lymphs Abs: 1.7 10*3/uL (ref 0.7–4.0)
MCH: 33.5 pg (ref 26.0–34.0)
MCHC: 33.6 g/dL (ref 30.0–36.0)
MCV: 99.7 fL (ref 80.0–100.0)
Monocytes Absolute: 1.2 10*3/uL — ABNORMAL HIGH (ref 0.1–1.0)
Monocytes Relative: 14 %
Neutro Abs: 5.7 10*3/uL (ref 1.7–7.7)
Neutrophils Relative %: 65 %
Platelets: 180 10*3/uL (ref 150–400)
RBC: 3.73 MIL/uL — ABNORMAL LOW (ref 3.87–5.11)
RDW: 12 % (ref 11.5–15.5)
WBC: 8.7 10*3/uL (ref 4.0–10.5)
nRBC: 0 % (ref 0.0–0.2)

## 2018-12-12 LAB — URINALYSIS, COMPLETE (UACMP) WITH MICROSCOPIC
Bilirubin Urine: NEGATIVE
Glucose, UA: NEGATIVE mg/dL
Ketones, ur: NEGATIVE mg/dL
Nitrite: NEGATIVE
Protein, ur: NEGATIVE mg/dL
Specific Gravity, Urine: 1.005 (ref 1.005–1.030)
WBC, UA: >50 WBC/hpf — ABNORMAL HIGH (ref 0–5)
pH: 7 (ref 5.0–8.0)

## 2018-12-12 MED ORDER — SODIUM CHLORIDE 0.9 % IV SOLN
1.0000 g | Freq: Once | INTRAVENOUS | Status: AC
  Administered 2018-12-12: 1 g via INTRAVENOUS
  Filled 2018-12-12: qty 10

## 2018-12-12 MED ORDER — CEPHALEXIN 500 MG PO CAPS: 500.0000 mg | ORAL_CAPSULE | Freq: Three times a day (TID) | ORAL | 0 refills | Status: DC

## 2018-12-12 MED ORDER — SODIUM CHLORIDE 0.9 % IV BOLUS
1000.0000 mL | Freq: Once | INTRAVENOUS | Status: AC
  Administered 2018-12-12: 1000 mL via INTRAVENOUS

## 2018-12-12 NOTE — ED Provider Notes (Signed)
[facility]    CSN: [id number] Arrival date & time: 12/12/18  1209  History   Chief Complaint Chief Complaint  Patient presents with  . Weakness   HPI  78 year old female presents with above complaint.  Patient reports that she has not been feeling well since July 4.  She had testing for COVID-19 and it was negative.  Patient reports that she feels weak.  She reports generalized weakness.  She states that she is now using a walker to ambulate as she does not feel steady on her feet.  She has had some lower abdominal pain as well.  She is also had a few episodes of emesis.  She reports intermittent diarrhea.  No documented fever.  Patient states that she feels very poorly and is unsure what is going on.  No other reported symptoms.  No other complaints or concerns at this time.  PMH, Surgical Hx, Family Hx, Social History reviewed and updated as below.  Past Medical History:  Diagnosis Date  . DVT (deep venous thrombosis) (HCC)   . GERD (gastroesophageal reflux disease)   . Hypertension   . PE (pulmonary embolism)     Patient Active Problem List   Diagnosis Date Noted  . Baden-Walker grade 3 cystocele 01/22/2017  . Urinary frequency 01/22/2017  . Vaginal atrophy 01/22/2017  . Chronic bilateral low back pain without sciatica 01/22/2017  . Nocturia 01/22/2017    Past Surgical History:  Procedure Laterality Date  . ABDOMINAL HYSTERECTOMY    . CHOLECYSTECTOMY    . KNEE ARTHROSCOPY Right    x 2  . KNEE SURGERY Left     OB History    Gravida  3   Para  3   Term  3   Preterm      AB      Living  3     SAB      TAB      Ectopic      Multiple      Live Births  3            Home Medications    Prior to Admission medications   Medication Sig Start Date End Date Taking? Authorizing Provider  acetaminophen (TYLENOL) 500 MG tablet Take 500 mg by mouth every 6 (six) hours as needed.   Yes [provider]  conjugated estrogens  (PREMARIN) vaginal cream Place 1 Applicatorful vaginally 2 (two) times a week. Use 1/2 gram twice weekly 01/20/18  Yes Hildred Laserherry, Anika, MD  lisinopril (PRINIVIL,ZESTRIL) 10 MG tablet Take 10 mg by mouth daily.   Yes [provider]  triamterene-hydrochlorothiazide (MAXZIDE-25) 37.5-25 MG tablet Take 1 tablet by mouth 2 (two) times daily.    Yes [provider]  warfarin (COUMADIN) 4 MG tablet Take 2 mg by mouth daily.    Yes [provider]  omeprazole (PRILOSEC) 20 MG capsule Take 20 mg by mouth daily.  12/12/18  [provider]    Family History Family History  Problem Relation Age of Onset  . Breast cancer Paternal Aunt 6680  . Kidney cancer Neg Hx   . Bladder Cancer Neg Hx     Social History Social History   Tobacco Use  . Smoking status: Never Smoker  . Smokeless tobacco: Never Used  Substance Use Topics  . Alcohol use: No  . Drug use: No     Allergies   Percocet [oxycodone-acetaminophen] and Sulfa antibiotics   Review of Systems Review of Systems  Gastrointestinal:  Positive for abdominal pain, diarrhea, nausea and vomiting.  Neurological: Positive for weakness.   Physical Exam Triage Vital Signs ED Triage Vitals  Enc Vitals Group     BP 12/12/18 1237 (!) 105/55     Pulse Rate 12/12/18 1237 79     Resp 12/12/18 1237 18     Temp 12/12/18 1237 98.2 F (36.8 C)     Temp Source 12/12/18 1237 Oral     SpO2 12/12/18 1237 100 %     Weight 12/12/18 1235 160 lb (72.6 kg)     Height --      Head Circumference --      Peak Flow --      Pain Score 12/12/18 1235 8     Pain Loc --      Pain Edu? --      Excl. in GC? --    Updated Vital Signs BP (!) 105/55 (BP Location: Right Arm)   Pulse 79   Temp 98.2 F (36.8 C) (Oral)   Resp 18   Wt 72.6 kg   SpO2 100%   BMI 27.46 kg/m   Visual Acuity Right Eye Distance:   Left Eye Distance:   Bilateral Distance:    Right Eye Near:   Left Eye Near:    Bilateral Near:     Physical Exam  Vitals signs and nursing note reviewed.  Constitutional:      General: She is not in acute distress.    Appearance: Normal appearance.  HENT:     Head: Normocephalic and atraumatic.  Eyes:     General:        Right eye: No discharge.        Left eye: No discharge.     Conjunctiva/sclera: Conjunctivae normal.  Cardiovascular:     Rate and Rhythm: Normal rate and regular rhythm.  Pulmonary:     Effort: Pulmonary effort is normal.     Breath sounds: Normal breath sounds.  Abdominal:     General: There is no distension.     Palpations: Abdomen is soft.     Comments: Tender to palpation left lower quadrant.  Neurological:     Mental Status: She is alert.  Psychiatric:        Mood and Affect: Mood normal.        Behavior: Behavior normal.    UC Treatments / Results  Labs (all labs ordered are listed, but only abnormal results are displayed) Labs Reviewed  CBC WITH DIFFERENTIAL/PLATELET - Abnormal; Notable for the following components:      Result Value   RBC 3.73 (*)    Monocytes Absolute 1.2 (*)    All other components within normal limits  COMPREHENSIVE METABOLIC PANEL - Abnormal; Notable for the following components:   Sodium 127 (*)    Chloride 95 (*)    Glucose, Bld 117 (*)    BUN 28 (*)    Creatinine, Ser 1.62 (*)    Albumin 2.8 (*)    AST 82 (*)    ALT 76 (*)    GFR calc non Af Amer 30 (*)    GFR calc Af Amer 35 (*)    All other components within normal limits    EKG   Radiology No results found.  Procedures Procedures (including critical care time)  Medications Ordered in UC Medications - No data to display  Initial Impression / Assessment and Plan / UC Course  I have reviewed the triage vital signs and the nursing  notes.  Pertinent labs & imaging results that were available during my care of the patient were reviewed by me and considered in my medical decision making (see chart for details).    78 year old female presents with generalized  weakness.  Laboratory studies obtained and revealed hyponatremia with sodium of 127, AKI with a creatinine of 1.62.  Mildly elevated liver enzymes.  Given age, persistence of symptoms, and laboratory findings I felt that it would be best that patient go to the hospital for IV fluids and further evaluation and management.  May need observation admission.  Patient is going via private vehicle to the ER.  Final Clinical Impressions(s) / UC Diagnoses   Final diagnoses:  AKI (acute kidney injury) ([place])  Hyponatremia  Generalized weakness     Discharge Instructions     Please go to the ER for IV fluids and possible observation admission.  Take care  Dr. Lacinda Axon    ED Prescriptions    None     Controlled Substance Prescriptions [place] Controlled Substance Registry consulted? Not Applicable   Coral Spikes, DO 12/12/18 1435

## 2018-12-12 NOTE — Discharge Instructions (Addendum)
Please seek medical attention for any high fevers, chest pain, shortness of breath, change in behavior, persistent vomiting, bloody stool or any other new or concerning symptoms.  

## 2018-12-12 NOTE — ED Provider Notes (Signed)
[facility] Emergency Department Provider Note  ____________________________________________   I have reviewed the triage vital signs and the nursing notes.   HISTORY  Chief Complaint Weakness and Dehydration   History limited by: Not Limited   HPI [name] is a 78 y.o. female who presents to the emergency department today from urgent care because of concern for dehydration. The patient states that she has felt weak for about three weeks. She says that things changed for her shortly after the 4th of July. She had some vomiting and diarrhea after that. Also had some pain in her lower abdomen. In the three weeks since then she has felt increasingly weak. Has had to use her walker now rather than her cane. The patient did have some vomiting again yesterday. Denies any fevers over the past few days.   Records reviewed. Per medical record review patient had blood work performed earlier today with Na of 127, Cr 1.62  Past Medical History:  Diagnosis Date  . DVT (deep venous thrombosis) (HCC)   . GERD (gastroesophageal reflux disease)   . Hypertension   . PE (pulmonary embolism)     Patient Active Problem List   Diagnosis Date Noted  . Baden-Walker grade 3 cystocele 01/22/2017  . Urinary frequency 01/22/2017  . Vaginal atrophy 01/22/2017  . Chronic bilateral low back pain without sciatica 01/22/2017  . Nocturia 01/22/2017    Past Surgical History:  Procedure Laterality Date  . ABDOMINAL HYSTERECTOMY    . CHOLECYSTECTOMY    . KNEE ARTHROSCOPY Right    x 2  . KNEE SURGERY Left     Prior to Admission medications   Medication Sig Start Date End Date Taking? Authorizing Provider  acetaminophen (TYLENOL) 500 MG tablet Take 500 mg by mouth every 6 (six) hours as needed.    [provider]  conjugated estrogens (PREMARIN) vaginal cream Place 1 Applicatorful vaginally 2 (two) times a week. Use 1/2 gram twice weekly 01/20/18   Hildred Laserherry, Anika, MD   lisinopril (PRINIVIL,ZESTRIL) 10 MG tablet Take 10 mg by mouth daily.    [provider]  triamterene-hydrochlorothiazide (MAXZIDE-25) 37.5-25 MG tablet Take 1 tablet by mouth 2 (two) times daily.     [provider]  warfarin (COUMADIN) 4 MG tablet Take 2 mg by mouth daily.     [provider]  omeprazole (PRILOSEC) 20 MG capsule Take 20 mg by mouth daily.  12/12/18  [provider]    Allergies Percocet [oxycodone-acetaminophen] and Sulfa antibiotics  Family History  Problem Relation Age of Onset  . Breast cancer Paternal Aunt 2180  . Kidney cancer Neg Hx   . Bladder Cancer Neg Hx     Social History Social History   Tobacco Use  . Smoking status: Never Smoker  . Smokeless tobacco: Never Used  Substance Use Topics  . Alcohol use: No  . Drug use: No    Review of Systems Constitutional: No fever/chills. Positive for generalized weakness.  Eyes: No visual changes. ENT: No sore throat. Cardiovascular: Denies chest pain. Respiratory: Denies shortness of breath. Gastrointestinal: Positive for lower abdominal pain. Positive for vomiting yesterday.    Genitourinary: Negative for dysuria. Musculoskeletal: Negative for back pain. Skin: Negative for rash. Neurological: Negative for headaches, focal weakness or numbness.  ____________________________________________   PHYSICAL EXAM:  VITAL SIGNS: ED Triage Vitals  Enc Vitals Group     BP 12/12/18 1456 (!) 113/52     Pulse Rate 12/12/18 1456 74  Resp 12/12/18 1456 16     Temp 12/12/18 1456 98.1 F (36.7 C)     Temp Source 12/12/18 1456 Oral     SpO2 12/12/18 1456 100 %     Weight 12/12/18 1457 160 lb (72.6 kg)     Height 12/12/18 1457 5\' 2"  (1.575 m)     Head Circumference --      Peak Flow --      Pain Score 12/12/18 1510 0   Constitutional: Alert and oriented.  Eyes: Conjunctivae are normal.  ENT      Head: Normocephalic and atraumatic.      Nose: No congestion/rhinnorhea.       Mouth/Throat: Mucous membranes are moist.      Neck: No stridor. Hematological/Lymphatic/Immunilogical: No cervical lymphadenopathy. Cardiovascular: Normal rate, regular rhythm.  No murmurs, rubs, or gallops.  Respiratory: Normal respiratory effort without tachypnea nor retractions. Breath sounds are clear and equal bilaterally. No wheezes/rales/rhonchi. Gastrointestinal: Soft and minimally tender over the lower abdomen.  Genitourinary: Deferred Musculoskeletal: Normal range of motion in all extremities. No lower extremity edema. Neurologic:  Normal speech and language. No gross focal neurologic deficits are appreciated.  Skin:  Skin is warm, dry and intact. No rash noted. Psychiatric: Mood and affect are normal. Speech and behavior are normal. Patient exhibits appropriate insight and judgment.  ____________________________________________    LABS (pertinent positives/negatives)  Labs from urgent care reviewed.  UA cloudy, large leukocytes, > 50 WBC, many bacteria, wbc present ____________________________________________   EKG  None  ____________________________________________    RADIOLOGY  None  ____________________________________________   PROCEDURES  Procedures  ____________________________________________   INITIAL IMPRESSION / ASSESSMENT AND PLAN / ED COURSE  Pertinent labs & imaging results that were available during my care of the patient were reviewed by me and considered in my medical decision making (see chart for details).   Patient presented to the emergency department today from urgent care because of concern for dehydration. Per lab review did have cr 1.6 over baseline of 1.2 and na was 127. Patient was given IV fluids. Given history of weakness UA was also tested and was consistent with infection. Discussed this with the patient. Will give dose of IV abx here and discharge with further antibiotics.    ____________________________________________   FINAL CLINICAL IMPRESSION(S) / ED DIAGNOSES  Final diagnoses:  Weakness  Dehydration  Lower urinary tract infectious disease     Note: This dictation was prepared with Dragon dictation. Any transcriptional errors that result from this process are unintentional     Nance Pear, MD 12/12/18 1806

## 2018-12-12 NOTE — ED Triage Notes (Signed)
Patient complains of weakness and vomiting. Patient states that she had similar symptoms on July 4th and was negative for Covid. Patient states that weakness has continued. One episode of vomiting yesterday but still has abdominal pain.

## 2018-12-12 NOTE — ED Triage Notes (Signed)
Pt to ED reporting weakness throughout this month. Intermittent abd pain and diarrhea. Pt was seen at PCP and blood work showed AKI and hyponatremia. Pt sent to ED for IV fluids and possible admission per pt.

## 2018-12-12 NOTE — Discharge Instructions (Signed)
Please go to the ER for IV fluids and possible observation admission.  Take care  Dr. Lacinda Axon

## 2019-01-20 ENCOUNTER — Encounter: Payer: Self-pay | Admitting: Obstetrics and Gynecology

## 2019-01-20 ENCOUNTER — Ambulatory Visit (INDEPENDENT_AMBULATORY_CARE_PROVIDER_SITE_OTHER): Payer: Medicare HMO | Admitting: Obstetrics and Gynecology

## 2019-01-20 ENCOUNTER — Other Ambulatory Visit: Payer: Self-pay

## 2019-01-20 VITALS — BP 142/80 | HR 88 | Ht 61.0 in | Wt 171.8 lb

## 2019-01-20 DIAGNOSIS — R2689 Other abnormalities of gait and mobility: Secondary | ICD-10-CM

## 2019-01-20 DIAGNOSIS — Z8639 Personal history of other endocrine, nutritional and metabolic disease: Secondary | ICD-10-CM

## 2019-01-20 DIAGNOSIS — N8110 Cystocele, unspecified: Secondary | ICD-10-CM | POA: Diagnosis not present

## 2019-01-20 DIAGNOSIS — Z87440 Personal history of urinary (tract) infections: Secondary | ICD-10-CM

## 2019-01-20 DIAGNOSIS — N952 Postmenopausal atrophic vaginitis: Secondary | ICD-10-CM

## 2019-01-20 DIAGNOSIS — Z4689 Encounter for fitting and adjustment of other specified devices: Secondary | ICD-10-CM | POA: Diagnosis not present

## 2019-01-20 MED ORDER — PREMARIN 0.625 MG/GM VA CREA: 1.0000 | TOPICAL_CREAM | VAGINAL | 4 refills | Status: AC

## 2019-01-20 MED ORDER — PREMARIN 0.625 MG/GM VA CREA: 1.0000 | TOPICAL_CREAM | VAGINAL | 4 refills | Status: DC

## 2019-01-20 NOTE — Progress Notes (Signed)
    GYNECOLOGY PROGRESS NOTE  Subjective:    Patient ID: [name], female    DOB: [birth year], 78 y.o.   MRN: [id number]  HPI  Patient is a 78 y.o. G72P3003 female who presents for pessary check for Grade 3 cystocele. Patient reports an episode of bleeding the day of her last pessary check, but none since then.  Notes that she uses her Premarin cream (but sometimes forgets).   Patient informs that she had a visit to the ER at the end of July for dehydration, fatigue, weakness, and fever.  Fel like her kidneys were "acting up".  Treated for a UTI. Of note, had been diagnosed with a UTI in June, treated with Macrobid.  Overall doing better, but feels that her balance is still off a little. Walking with cane assistance.   The following portions of the patient's history were reviewed and updated as appropriate: allergies, current medications, past family history, past medical history, past social history, past surgical history and problem list.   Review of Systems Pertinent items noted in HPI and remainder of comprehensive ROS otherwise negative.   Objective:   Blood pressure (!) 142/80, pulse 88, height 5\' 1"  (1.549 m), weight 171 lb 12.8 oz (77.9 kg). General appearance: alert and no distress Abdomen: soft, non-tender; bowel sounds normal; no masses,  no organomegaly Pelvic: ?External genitalia appeared normal, and intact. The patient's 2 3/4 Gelhorn pessary was removed, cleaned and replaced without complications. Speculum examination revealed mildlly atrophic vaginal mucosa. Mildly denuded vaginal tissue at vaginal cuff.    Labs:  Lab Results  Component Value Date   CREATININE 1.62 (H) 12/12/2018   BUN 28 (H) 12/12/2018   NA 127 (L) 12/12/2018   K 3.6 12/12/2018   CL 95 (L) 12/12/2018   CO2 23 12/12/2018   Lab Results  Component Value Date   CREATININE 1.62 (H) 12/12/2018    Lab Results  Component Value Date   ALT 76 (H) 12/12/2018   AST 82 (H) 12/12/2018   ALKPHOS 101  12/12/2018   BILITOT 0.3 12/12/2018     Assessment:   Pessary maintenance  Grade 3 cystocele Vaginal atrophy (mild) Balance disturbance History of dehydration History of UTI  Plan:    - Continue use of Premarin cream prescription. Continue to use twice weekly.  Stressed importance to help keeping vaginal spotting/bleeding to a minimum.  - Patient with prior dehydration and electrolyte abnormalities, likely secondary to UTI.  Still feeling some issues with balance. Will recheck CMP.  - Encourage to continue use of cane for assistance with walking until balance restored.  - The patient should return in 3 months for a pessary check.      Rubie Maid, MD [facility]

## 2019-01-20 NOTE — Progress Notes (Signed)
Pt stated that she was doing well with the pessary no problems.

## 2019-01-20 NOTE — Patient Instructions (Signed)

## 2019-03-28 ENCOUNTER — Other Ambulatory Visit: Payer: Self-pay | Admitting: Nephrology

## 2019-03-28 DIAGNOSIS — N1831 Chronic kidney disease, stage 3a: Secondary | ICD-10-CM

## 2019-04-03 ENCOUNTER — Ambulatory Visit
Admission: RE | Admit: 2019-04-03 | Discharge: 2019-04-03 | Disposition: A | Discharge status: Home / Self Care | Payer: Medicare HMO | Source: Ambulatory Visit | Attending: Nephrology | Admitting: Nephrology

## 2019-04-03 ENCOUNTER — Other Ambulatory Visit: Payer: Self-pay

## 2019-04-03 DIAGNOSIS — N1831 Chronic kidney disease, stage 3a: Secondary | ICD-10-CM | POA: Insufficient documentation

## 2019-04-21 ENCOUNTER — Encounter: Payer: Self-pay | Admitting: Obstetrics and Gynecology

## 2019-04-21 ENCOUNTER — Ambulatory Visit (INDEPENDENT_AMBULATORY_CARE_PROVIDER_SITE_OTHER): Payer: Medicare HMO | Admitting: Obstetrics and Gynecology

## 2019-04-21 ENCOUNTER — Other Ambulatory Visit: Payer: Self-pay

## 2019-04-21 VITALS — BP 148/78 | HR 81 | Ht 61.0 in | Wt 172.7 lb

## 2019-04-21 DIAGNOSIS — S30814A Abrasion of vagina and vulva, initial encounter: Secondary | ICD-10-CM

## 2019-04-21 DIAGNOSIS — N939 Abnormal uterine and vaginal bleeding, unspecified: Secondary | ICD-10-CM | POA: Diagnosis not present

## 2019-04-21 DIAGNOSIS — Z4689 Encounter for fitting and adjustment of other specified devices: Secondary | ICD-10-CM

## 2019-04-21 DIAGNOSIS — N8110 Cystocele, unspecified: Secondary | ICD-10-CM | POA: Diagnosis not present

## 2019-04-21 DIAGNOSIS — N952 Postmenopausal atrophic vaginitis: Secondary | ICD-10-CM | POA: Diagnosis not present

## 2019-04-21 DIAGNOSIS — Z7901 Long term (current) use of anticoagulants: Secondary | ICD-10-CM

## 2019-04-21 NOTE — Progress Notes (Signed)
    GYNECOLOGY PROGRESS NOTE  Subjective:    Patient ID: [name], female    DOB: 1940/10/05, 78 y.o.   MRN: [id number]  HPI  Patient is a 78 y.o. G72P3003 female who presents for pessary check for Grade 3 cystocele. Patient reports still having an occasional episode of light bleeding. Notes that she uses her Premarin cream (but sometimes forgets).    The following portions of the patient's history were reviewed and updated as appropriate: allergies, current medications, past family history, past medical history, past social history, past surgical history and problem list.   Review of Systems Pertinent items noted in HPI and remainder of comprehensive ROS otherwise negative.   Objective:   Blood pressure (!) 148/78, pulse 81, height 5\' 1"  (1.549 m), weight 172 lb 11.2 oz (78.3 kg). General appearance: alert and no distress Abdomen: soft, non-tender; bowel sounds normal; no masses,  no organomegaly Pelvic: ?External genitalia appeared normal, and intact. The patient's 2 3/4 Gelhorn pessary was removed, cleaned and replaced without complications. Speculum examination revealed mildlly atrophic vaginal mucosa. Abrasion noted on vaginal cuff.   Assessment:   Pessary maintenance  Grade 3 cystocele Vaginal atrophy (mild) Vaginal spotting Anticoagulant long term use Plan:    - Continue use of Premarin cream prescription. Encouraged to increase to 3 times weekly.  Again stressed importance to help keeping vaginal spotting/bleeding to a minimum. Patient also on an anticoagulant which makes her more prone to easy bleeding and bruising.  - The patient should return in 3 months for a pessary check.      Rubie Maid, MD [facility]

## 2019-04-21 NOTE — Progress Notes (Signed)
Pt is present for pessary check. Pt stated that she has been noticing vaginal bleeding that she thinks maybe coming from the pessary but is unsure.

## 2019-05-20 ENCOUNTER — Other Ambulatory Visit: Payer: Self-pay

## 2019-05-20 DIAGNOSIS — Z882 Allergy status to sulfonamides status: Secondary | ICD-10-CM

## 2019-05-20 DIAGNOSIS — R17 Unspecified jaundice: Secondary | ICD-10-CM | POA: Diagnosis present

## 2019-05-20 DIAGNOSIS — I9589 Other hypotension: Secondary | ICD-10-CM | POA: Diagnosis not present

## 2019-05-20 DIAGNOSIS — K853 Drug induced acute pancreatitis without necrosis or infection: Principal | ICD-10-CM | POA: Diagnosis present

## 2019-05-20 DIAGNOSIS — E872 Acidosis: Secondary | ICD-10-CM | POA: Diagnosis present

## 2019-05-20 DIAGNOSIS — I509 Heart failure, unspecified: Secondary | ICD-10-CM | POA: Diagnosis present

## 2019-05-20 DIAGNOSIS — Z86711 Personal history of pulmonary embolism: Secondary | ICD-10-CM

## 2019-05-20 DIAGNOSIS — K567 Ileus, unspecified: Secondary | ICD-10-CM | POA: Diagnosis present

## 2019-05-20 DIAGNOSIS — N3001 Acute cystitis with hematuria: Secondary | ICD-10-CM | POA: Diagnosis present

## 2019-05-20 DIAGNOSIS — N1831 Chronic kidney disease, stage 3a: Secondary | ICD-10-CM | POA: Diagnosis present

## 2019-05-20 DIAGNOSIS — R739 Hyperglycemia, unspecified: Secondary | ICD-10-CM | POA: Diagnosis present

## 2019-05-20 DIAGNOSIS — Z20822 Contact with and (suspected) exposure to covid-19: Secondary | ICD-10-CM | POA: Diagnosis present

## 2019-05-20 DIAGNOSIS — Z515 Encounter for palliative care: Secondary | ICD-10-CM | POA: Diagnosis not present

## 2019-05-20 DIAGNOSIS — R571 Hypovolemic shock: Secondary | ICD-10-CM | POA: Diagnosis not present

## 2019-05-20 DIAGNOSIS — Z66 Do not resuscitate: Secondary | ICD-10-CM | POA: Diagnosis not present

## 2019-05-20 DIAGNOSIS — Z79890 Hormone replacement therapy (postmenopausal): Secondary | ICD-10-CM

## 2019-05-20 DIAGNOSIS — Z79899 Other long term (current) drug therapy: Secondary | ICD-10-CM

## 2019-05-20 DIAGNOSIS — Z86718 Personal history of other venous thrombosis and embolism: Secondary | ICD-10-CM

## 2019-05-20 DIAGNOSIS — I248 Other forms of acute ischemic heart disease: Secondary | ICD-10-CM | POA: Diagnosis present

## 2019-05-20 DIAGNOSIS — K859 Acute pancreatitis without necrosis or infection, unspecified: Secondary | ICD-10-CM | POA: Diagnosis not present

## 2019-05-20 DIAGNOSIS — Z7901 Long term (current) use of anticoagulants: Secondary | ICD-10-CM

## 2019-05-20 DIAGNOSIS — E785 Hyperlipidemia, unspecified: Secondary | ICD-10-CM | POA: Diagnosis present

## 2019-05-20 DIAGNOSIS — I1310 Hypertensive heart and chronic kidney disease without heart failure, with stage 1 through stage 4 chronic kidney disease, or unspecified chronic kidney disease: Secondary | ICD-10-CM | POA: Diagnosis present

## 2019-05-20 DIAGNOSIS — J9601 Acute respiratory failure with hypoxia: Secondary | ICD-10-CM | POA: Diagnosis not present

## 2019-05-20 DIAGNOSIS — Z885 Allergy status to narcotic agent status: Secondary | ICD-10-CM

## 2019-05-20 DIAGNOSIS — K219 Gastro-esophageal reflux disease without esophagitis: Secondary | ICD-10-CM | POA: Diagnosis present

## 2019-05-20 DIAGNOSIS — N141 Nephropathy induced by other drugs, medicaments and biological substances: Secondary | ICD-10-CM | POA: Diagnosis present

## 2019-05-20 DIAGNOSIS — T508X5A Adverse effect of diagnostic agents, initial encounter: Secondary | ICD-10-CM | POA: Diagnosis not present

## 2019-05-20 DIAGNOSIS — N170 Acute kidney failure with tubular necrosis: Secondary | ICD-10-CM | POA: Diagnosis present

## 2019-05-20 DIAGNOSIS — E875 Hyperkalemia: Secondary | ICD-10-CM | POA: Diagnosis not present

## 2019-05-20 DIAGNOSIS — E876 Hypokalemia: Secondary | ICD-10-CM | POA: Diagnosis present

## 2019-05-20 DIAGNOSIS — B9620 Unspecified Escherichia coli [E. coli] as the cause of diseases classified elsewhere: Secondary | ICD-10-CM | POA: Diagnosis present

## 2019-05-20 DIAGNOSIS — R7401 Elevation of levels of liver transaminase levels: Secondary | ICD-10-CM | POA: Diagnosis present

## 2019-05-20 DIAGNOSIS — T502X5A Adverse effect of carbonic-anhydrase inhibitors, benzothiadiazides and other diuretics, initial encounter: Secondary | ICD-10-CM | POA: Diagnosis present

## 2019-05-20 LAB — COMPREHENSIVE METABOLIC PANEL
ALT: 27 U/L (ref 0–44)
AST: 47 U/L — ABNORMAL HIGH (ref 15–41)
Albumin: 3.9 g/dL (ref 3.5–5.0)
Alkaline Phosphatase: 50 U/L (ref 38–126)
Anion gap: 15 (ref 5–15)
BUN: 30 mg/dL — ABNORMAL HIGH (ref 8–23)
CO2: 20 mmol/L — ABNORMAL LOW (ref 22–32)
Calcium: 9.8 mg/dL (ref 8.9–10.3)
Chloride: 105 mmol/L (ref 98–111)
Creatinine, Ser: 1.13 mg/dL — ABNORMAL HIGH (ref 0.44–1.00)
GFR calc Af Amer: 53 mL/min — ABNORMAL LOW (ref >60)
GFR calc non Af Amer: 46 mL/min — ABNORMAL LOW (ref >60)
Glucose, Bld: 180 mg/dL — ABNORMAL HIGH (ref 70–99)
Potassium: 3.3 mmol/L — ABNORMAL LOW (ref 3.5–5.1)
Sodium: 140 mmol/L (ref 135–145)
Total Bilirubin: 0.8 mg/dL (ref 0.3–1.2)
Total Protein: 8.2 g/dL — ABNORMAL HIGH (ref 6.5–8.1)

## 2019-05-20 LAB — CBC
HCT: 41.6 % (ref 36.0–46.0)
Hemoglobin: 14.5 g/dL (ref 12.0–15.0)
MCH: 33 pg (ref 26.0–34.0)
MCHC: 34.9 g/dL (ref 30.0–36.0)
MCV: 94.5 fL (ref 80.0–100.0)
Platelets: 191 10*3/uL (ref 150–400)
RBC: 4.4 MIL/uL (ref 3.87–5.11)
RDW: 11.6 % (ref 11.5–15.5)
WBC: 10.5 10*3/uL (ref 4.0–10.5)
nRBC: 0 % (ref 0.0–0.2)

## 2019-05-20 LAB — LIPASE, BLOOD: Lipase: 1352 U/L — ABNORMAL HIGH (ref 11–51)

## 2019-05-20 LAB — TROPONIN I (HIGH SENSITIVITY): Troponin I (High Sensitivity): 11 ng/L (ref <18)

## 2019-05-20 NOTE — ED Triage Notes (Signed)
Patient reports upper abdominal/epigastric pain for approximately 2 hours.

## 2019-05-21 ENCOUNTER — Encounter: Payer: Self-pay | Admitting: Radiology

## 2019-05-21 ENCOUNTER — Inpatient Hospital Stay
Admission: EM | Admit: 2019-05-21 | Discharge: 2019-06-19 | DRG: 438 | Disposition: E | Payer: Medicare HMO | Attending: Pulmonary Disease | Admitting: Pulmonary Disease

## 2019-05-21 ENCOUNTER — Emergency Department: Payer: Medicare HMO

## 2019-05-21 ENCOUNTER — Inpatient Hospital Stay: Payer: Medicare HMO

## 2019-05-21 ENCOUNTER — Other Ambulatory Visit: Payer: Self-pay

## 2019-05-21 DIAGNOSIS — Z4659 Encounter for fitting and adjustment of other gastrointestinal appliance and device: Secondary | ICD-10-CM

## 2019-05-21 DIAGNOSIS — N390 Urinary tract infection, site not specified: Secondary | ICD-10-CM | POA: Diagnosis not present

## 2019-05-21 DIAGNOSIS — R571 Hypovolemic shock: Secondary | ICD-10-CM | POA: Diagnosis not present

## 2019-05-21 DIAGNOSIS — N1831 Chronic kidney disease, stage 3a: Secondary | ICD-10-CM | POA: Diagnosis present

## 2019-05-21 DIAGNOSIS — I10 Essential (primary) hypertension: Secondary | ICD-10-CM

## 2019-05-21 DIAGNOSIS — K851 Biliary acute pancreatitis without necrosis or infection: Secondary | ICD-10-CM | POA: Diagnosis not present

## 2019-05-21 DIAGNOSIS — Z515 Encounter for palliative care: Secondary | ICD-10-CM

## 2019-05-21 DIAGNOSIS — E872 Acidosis: Secondary | ICD-10-CM | POA: Diagnosis present

## 2019-05-21 DIAGNOSIS — N189 Chronic kidney disease, unspecified: Secondary | ICD-10-CM

## 2019-05-21 DIAGNOSIS — R0602 Shortness of breath: Secondary | ICD-10-CM

## 2019-05-21 DIAGNOSIS — Z86711 Personal history of pulmonary embolism: Secondary | ICD-10-CM

## 2019-05-21 DIAGNOSIS — E785 Hyperlipidemia, unspecified: Secondary | ICD-10-CM | POA: Diagnosis present

## 2019-05-21 DIAGNOSIS — I248 Other forms of acute ischemic heart disease: Secondary | ICD-10-CM | POA: Diagnosis present

## 2019-05-21 DIAGNOSIS — K8501 Idiopathic acute pancreatitis with uninfected necrosis: Secondary | ICD-10-CM | POA: Diagnosis not present

## 2019-05-21 DIAGNOSIS — E876 Hypokalemia: Secondary | ICD-10-CM | POA: Diagnosis present

## 2019-05-21 DIAGNOSIS — I1310 Hypertensive heart and chronic kidney disease without heart failure, with stage 1 through stage 4 chronic kidney disease, or unspecified chronic kidney disease: Secondary | ICD-10-CM | POA: Diagnosis present

## 2019-05-21 DIAGNOSIS — Z66 Do not resuscitate: Secondary | ICD-10-CM | POA: Diagnosis not present

## 2019-05-21 DIAGNOSIS — E875 Hyperkalemia: Secondary | ICD-10-CM | POA: Diagnosis not present

## 2019-05-21 DIAGNOSIS — N141 Nephropathy induced by other drugs, medicaments and biological substances: Secondary | ICD-10-CM | POA: Diagnosis present

## 2019-05-21 DIAGNOSIS — T502X5A Adverse effect of carbonic-anhydrase inhibitors, benzothiadiazides and other diuretics, initial encounter: Secondary | ICD-10-CM | POA: Diagnosis present

## 2019-05-21 DIAGNOSIS — K8531 Drug induced acute pancreatitis with uninfected necrosis: Secondary | ICD-10-CM | POA: Diagnosis not present

## 2019-05-21 DIAGNOSIS — E861 Hypovolemia: Secondary | ICD-10-CM

## 2019-05-21 DIAGNOSIS — K859 Acute pancreatitis without necrosis or infection, unspecified: Secondary | ICD-10-CM | POA: Diagnosis present

## 2019-05-21 DIAGNOSIS — R0603 Acute respiratory distress: Secondary | ICD-10-CM

## 2019-05-21 DIAGNOSIS — T508X5A Adverse effect of diagnostic agents, initial encounter: Secondary | ICD-10-CM | POA: Diagnosis not present

## 2019-05-21 DIAGNOSIS — K567 Ileus, unspecified: Secondary | ICD-10-CM | POA: Diagnosis present

## 2019-05-21 DIAGNOSIS — K853 Drug induced acute pancreatitis without necrosis or infection: Secondary | ICD-10-CM | POA: Diagnosis present

## 2019-05-21 DIAGNOSIS — I9589 Other hypotension: Secondary | ICD-10-CM

## 2019-05-21 DIAGNOSIS — N3001 Acute cystitis with hematuria: Secondary | ICD-10-CM

## 2019-05-21 DIAGNOSIS — K219 Gastro-esophageal reflux disease without esophagitis: Secondary | ICD-10-CM | POA: Diagnosis present

## 2019-05-21 DIAGNOSIS — R17 Unspecified jaundice: Secondary | ICD-10-CM | POA: Diagnosis present

## 2019-05-21 DIAGNOSIS — N179 Acute kidney failure, unspecified: Secondary | ICD-10-CM

## 2019-05-21 DIAGNOSIS — B9620 Unspecified Escherichia coli [E. coli] as the cause of diseases classified elsewhere: Secondary | ICD-10-CM | POA: Diagnosis present

## 2019-05-21 DIAGNOSIS — Z452 Encounter for adjustment and management of vascular access device: Secondary | ICD-10-CM

## 2019-05-21 DIAGNOSIS — K8500 Idiopathic acute pancreatitis without necrosis or infection: Secondary | ICD-10-CM | POA: Diagnosis not present

## 2019-05-21 DIAGNOSIS — I878 Other specified disorders of veins: Secondary | ICD-10-CM | POA: Diagnosis not present

## 2019-05-21 DIAGNOSIS — J9601 Acute respiratory failure with hypoxia: Secondary | ICD-10-CM | POA: Diagnosis not present

## 2019-05-21 DIAGNOSIS — N170 Acute kidney failure with tubular necrosis: Secondary | ICD-10-CM | POA: Diagnosis present

## 2019-05-21 DIAGNOSIS — Z20822 Contact with and (suspected) exposure to covid-19: Secondary | ICD-10-CM | POA: Diagnosis present

## 2019-05-21 DIAGNOSIS — I509 Heart failure, unspecified: Secondary | ICD-10-CM | POA: Diagnosis present

## 2019-05-21 LAB — LIPID PANEL
Cholesterol: 110 mg/dL (ref 0–200)
HDL: 52 mg/dL (ref >40)
LDL Cholesterol: 46 mg/dL (ref 0–99)
Total CHOL/HDL Ratio: 2.1 RATIO
Triglycerides: 61 mg/dL (ref <150)
VLDL: 12 mg/dL (ref 0–40)

## 2019-05-21 LAB — LIPASE, BLOOD: Lipase: 570 U/L — ABNORMAL HIGH (ref 11–51)

## 2019-05-21 LAB — URINALYSIS, COMPLETE (UACMP) WITH MICROSCOPIC
Bilirubin Urine: NEGATIVE
Glucose, UA: NEGATIVE mg/dL
Ketones, ur: NEGATIVE mg/dL
Nitrite: NEGATIVE
Protein, ur: NEGATIVE mg/dL
Specific Gravity, Urine: 1.025 (ref 1.005–1.030)
WBC, UA: >50 WBC/hpf — ABNORMAL HIGH (ref 0–5)
pH: 5.5 (ref 5.0–8.0)

## 2019-05-21 LAB — BASIC METABOLIC PANEL
Anion gap: 12 (ref 5–15)
BUN: 28 mg/dL — ABNORMAL HIGH (ref 8–23)
CO2: 19 mmol/L — ABNORMAL LOW (ref 22–32)
Calcium: 7.6 mg/dL — ABNORMAL LOW (ref 8.9–10.3)
Chloride: 111 mmol/L (ref 98–111)
Creatinine, Ser: 1.09 mg/dL — ABNORMAL HIGH (ref 0.44–1.00)
GFR calc Af Amer: 56 mL/min — ABNORMAL LOW (ref >60)
GFR calc non Af Amer: 48 mL/min — ABNORMAL LOW (ref >60)
Glucose, Bld: 153 mg/dL — ABNORMAL HIGH (ref 70–99)
Potassium: 3.2 mmol/L — ABNORMAL LOW (ref 3.5–5.1)
Sodium: 142 mmol/L (ref 135–145)

## 2019-05-21 LAB — TROPONIN I (HIGH SENSITIVITY)
Troponin I (High Sensitivity): 30 ng/L — ABNORMAL HIGH (ref <18)
Troponin I (High Sensitivity): 110 ng/L (ref <18)

## 2019-05-21 LAB — CBC
HCT: 48.3 % — ABNORMAL HIGH (ref 36.0–46.0)
Hemoglobin: 15.9 g/dL — ABNORMAL HIGH (ref 12.0–15.0)
MCH: 32.7 pg (ref 26.0–34.0)
MCHC: 32.9 g/dL (ref 30.0–36.0)
MCV: 99.4 fL (ref 80.0–100.0)
Platelets: 194 10*3/uL (ref 150–400)
RBC: 4.86 MIL/uL (ref 3.87–5.11)
RDW: 11.8 % (ref 11.5–15.5)
WBC: 11.1 10*3/uL — ABNORMAL HIGH (ref 4.0–10.5)
nRBC: 0 % (ref 0.0–0.2)

## 2019-05-21 LAB — SARS CORONAVIRUS 2 (TAT 6-24 HRS): SARS Coronavirus 2: NEGATIVE

## 2019-05-21 LAB — PROTIME-INR
INR: 2.4 — ABNORMAL HIGH (ref 0.8–1.2)
Prothrombin Time: 25.8 seconds — ABNORMAL HIGH (ref 11.4–15.2)

## 2019-05-21 MED ORDER — ACETAMINOPHEN 650 MG RE SUPP: 650.0000 mg | Freq: Four times a day (QID) | RECTAL | Status: DC | PRN

## 2019-05-21 MED ORDER — ONDANSETRON HCL 4 MG PO TABS: 4.0000 mg | ORAL_TABLET | Freq: Four times a day (QID) | ORAL | Status: DC | PRN

## 2019-05-21 MED ORDER — PANTOPRAZOLE SODIUM 40 MG PO TBEC
40.0000 mg | DELAYED_RELEASE_TABLET | Freq: Every day | ORAL | Status: DC
  Filled 2019-05-21 (×2): qty 1

## 2019-05-21 MED ORDER — ONDANSETRON HCL 4 MG/2ML IJ SOLN
4.0000 mg | Freq: Once | INTRAMUSCULAR | Status: AC
  Administered 2019-05-21: 4 mg via INTRAVENOUS
  Filled 2019-05-21: qty 2

## 2019-05-21 MED ORDER — WARFARIN SODIUM 4 MG PO TABS
4.0000 mg | ORAL_TABLET | Freq: Every evening | ORAL | Status: DC
  Filled 2019-05-21: qty 1

## 2019-05-21 MED ORDER — MAGNESIUM HYDROXIDE 400 MG/5ML PO SUSP: 30.0000 mL | Freq: Every day | ORAL | Status: DC | PRN

## 2019-05-21 MED ORDER — SODIUM CHLORIDE 0.9 % IV BOLUS
1000.0000 mL | Freq: Once | INTRAVENOUS | Status: AC
  Administered 2019-05-21: 15:00:00 1000 mL via INTRAVENOUS

## 2019-05-21 MED ORDER — BISACODYL 10 MG RE SUPP
10.0000 mg | Freq: Once | RECTAL | Status: AC
  Administered 2019-05-21: 10 mg via RECTAL
  Filled 2019-05-21: qty 1

## 2019-05-21 MED ORDER — FENTANYL CITRATE (PF) 100 MCG/2ML IJ SOLN
25.0000 ug | INTRAMUSCULAR | Status: DC | PRN
  Administered 2019-05-22: 25 ug via INTRAVENOUS
  Filled 2019-05-21 (×2): qty 2

## 2019-05-21 MED ORDER — SODIUM CHLORIDE 0.9 % IV BOLUS
1000.0000 mL | Freq: Once | INTRAVENOUS | Status: AC
  Administered 2019-05-21: 02:00:00 1000 mL via INTRAVENOUS

## 2019-05-21 MED ORDER — IOHEXOL 300 MG/ML  SOLN
100.0000 mL | Freq: Once | INTRAMUSCULAR | Status: AC | PRN
  Administered 2019-05-21: 02:00:00 100 mL via INTRAVENOUS

## 2019-05-21 MED ORDER — METOPROLOL TARTRATE 5 MG/5ML IV SOLN
5.0000 mg | Freq: Three times a day (TID) | INTRAVENOUS | Status: DC | PRN
  Administered 2019-05-22 – 2019-05-23 (×2): 5 mg via INTRAVENOUS
  Filled 2019-05-21 (×2): qty 5

## 2019-05-21 MED ORDER — ADULT MULTIVITAMIN W/MINERALS CH
1.0000 | ORAL_TABLET | Freq: Every day | ORAL | Status: DC
  Filled 2019-05-21: qty 1

## 2019-05-21 MED ORDER — POTASSIUM CHLORIDE 10 MEQ/100ML IV SOLN
10.0000 meq | Freq: Once | INTRAVENOUS | Status: AC
  Administered 2019-05-21: 18:00:00 10 meq via INTRAVENOUS

## 2019-05-21 MED ORDER — LISINOPRIL 10 MG PO TABS
10.0000 mg | ORAL_TABLET | Freq: Every day | ORAL | Status: DC
  Filled 2019-05-21: qty 1

## 2019-05-21 MED ORDER — CALCIUM CARBONATE-VITAMIN D 500-200 MG-UNIT PO TABS
1.0000 | ORAL_TABLET | Freq: Every day | ORAL | Status: DC
  Filled 2019-05-21: qty 1

## 2019-05-21 MED ORDER — MORPHINE SULFATE (PF) 4 MG/ML IV SOLN
4.0000 mg | Freq: Once | INTRAVENOUS | Status: AC
  Administered 2019-05-21: 03:00:00 4 mg via INTRAVENOUS
  Filled 2019-05-21: qty 1

## 2019-05-21 MED ORDER — ENOXAPARIN SODIUM 40 MG/0.4ML ~~LOC~~ SOLN
40.0000 mg | SUBCUTANEOUS | Status: DC
  Administered 2019-05-21: 40 mg via SUBCUTANEOUS
  Filled 2019-05-21: qty 0.4

## 2019-05-21 MED ORDER — POTASSIUM CHLORIDE 10 MEQ/100ML IV SOLN
10.0000 meq | INTRAVENOUS | Status: AC
  Administered 2019-05-21 (×2): 10 meq via INTRAVENOUS
  Filled 2019-05-21 (×2): qty 100

## 2019-05-21 MED ORDER — CHLORHEXIDINE GLUCONATE CLOTH 2 % EX PADS
6.0000 | MEDICATED_PAD | Freq: Every day | CUTANEOUS | Status: DC
  Administered 2019-05-21 – 2019-05-23 (×3): 6 via TOPICAL

## 2019-05-21 MED ORDER — KETOROLAC TROMETHAMINE 15 MG/ML IJ SOLN
15.0000 mg | Freq: Four times a day (QID) | INTRAMUSCULAR | Status: DC | PRN
  Administered 2019-05-21 – 2019-05-22 (×2): 15 mg via INTRAVENOUS
  Filled 2019-05-21 (×2): qty 1

## 2019-05-21 MED ORDER — SODIUM CHLORIDE 0.9 % IV SOLN: INTRAVENOUS | Status: DC

## 2019-05-21 MED ORDER — SODIUM CHLORIDE 0.9 % IV SOLN
1.0000 g | INTRAVENOUS | Status: DC
  Administered 2019-05-22: 1 g via INTRAVENOUS
  Filled 2019-05-21: qty 1

## 2019-05-21 MED ORDER — FAMOTIDINE IN NACL 20-0.9 MG/50ML-% IV SOLN
20.0000 mg | Freq: Every day | INTRAVENOUS | Status: DC
  Administered 2019-05-22: 20 mg via INTRAVENOUS
  Filled 2019-05-21: qty 50

## 2019-05-21 MED ORDER — ESTROGENS, CONJUGATED 0.625 MG/GM VA CREA
1.0000 | TOPICAL_CREAM | VAGINAL | Status: DC
  Filled 2019-05-21 (×2): qty 30

## 2019-05-21 MED ORDER — ACETAMINOPHEN 325 MG PO TABS: 650.0000 mg | ORAL_TABLET | Freq: Four times a day (QID) | ORAL | Status: DC | PRN

## 2019-05-21 MED ORDER — POTASSIUM CHLORIDE 10 MEQ/100ML IV SOLN
10.0000 meq | INTRAVENOUS | Status: AC
  Administered 2019-05-21: 13:00:00 10 meq via INTRAVENOUS
  Filled 2019-05-21: qty 100

## 2019-05-21 MED ORDER — FENTANYL CITRATE (PF) 100 MCG/2ML IJ SOLN
50.0000 ug | Freq: Once | INTRAMUSCULAR | Status: AC
  Administered 2019-05-21: 01:00:00 50 ug via INTRAVENOUS
  Filled 2019-05-21: qty 2

## 2019-05-21 MED ORDER — SODIUM CHLORIDE 0.9 % IV SOLN
1.0000 g | Freq: Once | INTRAVENOUS | Status: AC
  Administered 2019-05-21: 04:00:00 1 g via INTRAVENOUS
  Filled 2019-05-21: qty 10

## 2019-05-21 MED ORDER — TRIAMTERENE-HCTZ 37.5-25 MG PO TABS: 1.0000 | ORAL_TABLET | Freq: Every day | ORAL | Status: DC

## 2019-05-21 MED ORDER — WARFARIN - PHARMACIST DOSING INPATIENT: Freq: Every day | Status: DC

## 2019-05-21 MED ORDER — ONDANSETRON HCL 4 MG/2ML IJ SOLN
4.0000 mg | Freq: Four times a day (QID) | INTRAMUSCULAR | Status: DC | PRN
  Administered 2019-05-21 – 2019-05-23 (×2): 4 mg via INTRAVENOUS
  Filled 2019-05-21 (×2): qty 2

## 2019-05-21 MED ORDER — TRAZODONE HCL 50 MG PO TABS: 25.0000 mg | ORAL_TABLET | Freq: Every evening | ORAL | Status: DC | PRN

## 2019-05-21 NOTE — ED Notes (Signed)
Pt returned from CT °

## 2019-05-21 NOTE — H&P (Addendum)
[place] [facility]   PATIENT NAME: [name]    MR#:  [id number]  DATE OF BIRTH:  05-Dec-1940  DATE OF ADMISSION:  05/29/19  PRIMARY CARE PHYSICIAN: Sherrie Mustache, MD   REQUESTING/REFERRING PHYSICIAN: Cecil Cobbs, MD CHIEF COMPLAINT:   Chief Complaint  Patient presents with  . Abdominal Pain    HISTORY OF PRESENT ILLNESS:  [name]  is a 79 y.o. female with a known history of hypertension, GERD, DVT and PE, who presented to the emergency room with acute onset of epigastric abdominal pain with associated nausea and vomiting.  Her pain started on Saturday and her vomiting right before she came to the ER.  She denies any bilious vomitus or hematemesis.  She admits to urinary frequency and urgency with left flank pain without oliguria or hematuria.  No bleeding diathesis.  No headache or dizziness or blurred vision.  No cough or wheezing or dyspnea or chest pain.  No recent COVID-19 exposure.  Upon presentation to the emergency room, blood pressure was 167/67 with otherwise normal vital signs.  Labs revealed mild hypokalemia of 3.3 and a BUN of 30 with a creatinine 1.13.  Lipase came significantly elevated at 1352 and AST slightly elevated at 47.  CBC showed no abnormalities.  INR was 2.4 on Coumadin with PT of 25.8.  Urinalysis showed more than 50 WBCs and 6-10 RBCs with positive WBC clumps.  Urine culture was sent.  Troponin I was initially 11 and later 30.  Abdominal and pelvic CT scan showed acute pancreatitis with moderate severe peripancreatic fat stranding and free fluid with no evidence of necrosis or fluid collection.  There were fluid-filled distended distal esophagus, stomach and proximal small bowel likely secondary to pancreatitis.  EKG showed normal sinus rhythm with a rate of 75 with no acute changes.  The patient was given 50 mcg of IV fentanyl, 4 mg of IV morphine sulfate and 4 mg of IV Zofran, 1 L bolus of IV normal saline and a gram of IV  Rocephin.  She will be admitted to medical monitored bed for further evaluation and management. PAST MEDICAL HISTORY:   Past Medical History:  Diagnosis Date  . DVT (deep venous thrombosis) (HCC)   . GERD (gastroesophageal reflux disease)   . Hypertension   . PE (pulmonary embolism)     PAST SURGICAL HISTORY:   Past Surgical History:  Procedure Laterality Date  . ABDOMINAL HYSTERECTOMY    . CHOLECYSTECTOMY    . KNEE ARTHROSCOPY Right    x 2  . KNEE SURGERY Left     SOCIAL HISTORY:   Social History   Tobacco Use  . Smoking status: Never Smoker  . Smokeless tobacco: Never Used  Substance Use Topics  . Alcohol use: No    FAMILY HISTORY:   Family History  Problem Relation Age of Onset  . Breast cancer Paternal Aunt 72  . Kidney cancer Neg Hx   . Bladder Cancer Neg Hx     DRUG ALLERGIES:   Allergies  Allergen Reactions  . Percocet [Oxycodone-Acetaminophen] Itching  . Sulfa Antibiotics Rash    REVIEW OF SYSTEMS:   ROS As per history of present illness. All pertinent systems were reviewed above. Constitutional,  HEENT, cardiovascular, respiratory, GI, GU, musculoskeletal, neuro, psychiatric, endocrine,  integumentary and hematologic systems were reviewed and are otherwise  negative/unremarkable except for positive findings mentioned above in the HPI.   MEDICATIONS AT HOME:   Prior to Admission medications  Medication Sig Start Date End Date Taking? Authorizing Provider  acetaminophen (TYLENOL) 500 MG tablet Take 500 mg by mouth every 6 (six) hours as needed for mild pain or fever.    Yes [provider]  Calcium Carbonate-Vitamin D 600-400 MG-UNIT tablet Take 1 tablet by mouth daily.   Yes [provider]  conjugated estrogens (PREMARIN) vaginal cream Place 1 Applicatorful vaginally 2 (two) times a week. Use 1/2 gram twice weekly 01/23/19  Yes Rubie Maid, MD  lisinopril (PRINIVIL,ZESTRIL) 10 MG tablet Take 10 mg by mouth daily.   Yes  [provider]  Multiple Vitamins-Minerals (CENTRUM SILVER 50+WOMEN) TABS Take 1 tablet by mouth daily.   Yes [provider]  omeprazole (PRILOSEC) 20 MG capsule Take 20 mg by mouth daily.   Yes [provider]  triamterene-hydrochlorothiazide (MAXZIDE-25) 37.5-25 MG tablet Take 1 tablet by mouth daily.    Yes [provider]  warfarin (COUMADIN) 4 MG tablet Take 4 mg by mouth every evening.    Yes [provider]      VITAL SIGNS:  Blood pressure (!) 158/80, pulse 95, temperature 98.7 F (37.1 C), temperature source Oral, resp. rate 14, height 5\' 2"  (1.575 m), weight 77.1 kg, SpO2 99 %.  PHYSICAL EXAMINATION:  Physical Exam  GENERAL:  79 y.o.-year-old patient lying in the bed with no acute distress.  EYES: Pupils equal, round, reactive to light and accommodation. No scleral icterus. Extraocular muscles intact.  HEENT: Head atraumatic, normocephalic. Oropharynx and nasopharynx clear.  NECK:  Supple, no jugular venous distention. No thyroid enlargement, no tenderness.  LUNGS: Normal breath sounds bilaterally, no wheezing, rales,rhonchi or crepitation. No use of accessory muscles of respiration.  CARDIOVASCULAR: Regular rate and rhythm, S1, S2 normal. No murmurs, rubs, or gallops.  ABDOMEN: Soft, nondistended with epigastric, left upper quadrant tenderness except right upper quadrant tenderness without rebound tenderness guarding or rigidity.  Bowel sounds are slightly diminished.. No organomegaly or mass.  EXTREMITIES: No pedal edema, cyanosis, or clubbing.  NEUROLOGIC: Cranial nerves II through XII are intact. Muscle strength 5/5 in all extremities. Sensation intact. Gait not checked.  PSYCHIATRIC: The patient is alert and oriented x 3.  Normal affect and good eye contact. SKIN: No obvious rash, lesion, or ulcer.   LABORATORY PANEL:   CBC Recent Labs  Lab 05/20/19 1920  WBC 10.5  HGB 14.5  HCT 41.6  PLT 191    ------------------------------------------------------------------------------------------------------------------  Chemistries  Recent Labs  Lab 05/20/19 1920  NA 140  K 3.3*  CL 105  CO2 20*  GLUCOSE 180*  BUN 30*  CREATININE 1.13*  CALCIUM 9.8  AST 47*  ALT 27  ALKPHOS 50  BILITOT 0.8   ------------------------------------------------------------------------------------------------------------------  Cardiac Enzymes No results for input(s): TROPONINI in the last 168 hours. ------------------------------------------------------------------------------------------------------------------  RADIOLOGY:  CT ABDOMEN PELVIS W CONTRAST  Result Date: 06/04/2019 CLINICAL DATA:  Epigastric and upper abdominal pain for 2 hours. EXAM: CT ABDOMEN AND PELVIS WITH CONTRAST TECHNIQUE: Multidetector CT imaging of the abdomen and pelvis was performed using the standard protocol following bolus administration of intravenous contrast. CONTRAST:  169mL OMNIPAQUE IOHEXOL 300 MG/ML  SOLN COMPARISON:  CT 06/21/2013 FINDINGS: Lower chest: Bibasilar atelectasis, right greater than left. Suspected bibasilar bronchiectasis. No pleural fluid. Hepatobiliary: No focal hepatic abnormality. Postcholecystectomy. No biliary dilatation. No visualized choledocholithiasis. Pancreas: Moderate to severe peripancreatic fat stranding and free fluid about the uncinate process, head, and proximal body of the pancreas. Moderate non organized free fluid in the upper abdomen. No  evidence of pancreatic necrosis. No organized fluid collection. No ductal dilatation. No visualized pancreatic mass. Spleen: Normal in size without focal abnormality. Adrenals/Urinary Tract: Normal adrenal glands. Multiple bilateral renal cysts. No hydronephrosis. No evidence of solid renal lesion. Homogeneous renal enhancement with symmetric excretion on delayed phase imaging. Urinary bladder is physiologically distended. No bladder wall thickening.  Stomach/Bowel: Distended distal esophagus with intraluminal fluid. Fluid-filled distended stomach. No gastric wall thickening. Prominent fluid-filled duodenum without duodenal wall thickening. Proximal jejunum also fluid-filled. There is no small bowel wall thickening or inflammation. No obstruction. Normal appendix. Small volume of stool in the colon. No colonic wall thickening. Vascular/Lymphatic: Prominent portal caval and peripancreatic nodes, all subcentimeter. Portal vein is patent. Mesenteric vessels are patent. Aortic atherosclerosis without aneurysm. Reproductive: Pessary in place. Hysterectomy. No adnexal mass. Other: Moderate volume of free fluid in the upper abdomen about the pancreas, duodenum, and tracking in the right upper quadrant. Small amount of free fluid in the right pericolic gutter and pelvis. No free air. No organized collection. Musculoskeletal: Multilevel degenerative change in the spine. There are no acute or suspicious osseous abnormalities. IMPRESSION: 1. Acute pancreatitis with moderate to severe peripancreatic fat stranding and free fluid. No evidence of pancreatic necrosis or organized fluid collection. 2. Fluid-filled distended distal esophagus, stomach and proximal small bowel, likely secondary to pancreatitis. Aortic Atherosclerosis (ICD10-I70.0). Electronically Signed   By: Narda Rutherford M.D.   On: 05/30/2019 01:59      IMPRESSION AND PLAN:   1.  Acute pancreatitis possibly related to HCTZ with likely associated mild ileus.  The patient will be admitted to a medical monitored bed.  She will be kept n.p.o.  She will be hydrated with IV normal saline.  Pain management will be provided.  Serum lipase levels will be followed.  We will stop HCTZ in [place].  We will check fasting lipids assess for hypertriglyceridemia.  2.  Suspected UTI.  The patient will be placed on IV Rocephin and urine culture and sensitivity will be obtained.  3.  History of DVT and PE.  We will  continue Coumadin.  4.  Hypertension.  Continue lisinopril and stop HCTZ in [place].  5.  GERD.  The patient will be placed on IV Pepcid  6.  DVT prophylaxis.  We will continue p.o. Coumadin and follow INR.   All the records are reviewed and case discussed with ED provider. The plan of care was discussed in details with the patient (and family). I answered all questions. The patient agreed to proceed with the above mentioned plan. Further management will depend upon hospital course.   CODE STATUS: Full code  TOTAL TIME TAKING CARE OF THIS PATIENT: 50 minutes.    Hannah Beat M.D on 06/02/2019 at 4:20 AM  [facility]   From 7 PM-7 AM, contact night-coverage [email or web address]  CC: Primary care physician; Sherrie Mustache, MD   Note: This dictation was prepared with Dragon dictation along with smaller phrase technology. Any transcriptional errors that result from this process are unintentional.

## 2019-05-21 NOTE — Progress Notes (Signed)
Rapid called at 1414 d/t a rapid change in condition.  Patient became nauseous, tachypneic, cold, and clammy. Blood pressure low and then elevated.   Dr. Mayford Knife arrived bedside and ordered CXR & abd xray, bolus of 9%NS.  Doctor ordered NG tube placed to intermittent suction for suspected ileus and foley to monitor I & O's.    Patient's condition has stabilized and this nurse will continue to monitor closely.

## 2019-05-21 NOTE — ED Notes (Signed)
ED TO INPATIENT HANDOFF REPORT  ED Nurse Name and Phone #: Anette Riedel [phone]  S Name/Age/Gender [name] 79 y.o. female Room/Bed: ED11HA/ED11HA  Code Status   Code Status: Full Code  Home/SNF/Other Home Patient oriented to: self, place, time and situation Is this baseline? Yes   Triage Complete: Triage complete  Chief Complaint Acute pancreatitis [K85.90]  Triage Note Patient reports upper abdominal/epigastric pain for approximately 2 hours.    Allergies Allergies  Allergen Reactions  . Percocet [Oxycodone-Acetaminophen] Itching  . Sulfa Antibiotics Rash    Level of Care/Admitting Diagnosis ED Disposition    ED Disposition Condition Comment   Admit  Hospital Area: [facility] [[id number]]  Level of Care: Med-Surg [16]  Covid Evaluation: Asymptomatic Screening Protocol (No Symptoms)  Diagnosis: Acute pancreatitis [577.0.ICD-9-CM]  Admitting Physician: Hannah Beat [[id number]]  Attending Physician: Hannah Beat [[id number]]  Estimated length of stay: past midnight tomorrow  Certification:: I certify this patient will need inpatient services for at least 2 midnights       B Medical/Surgery History Past Medical History:  Diagnosis Date  . DVT (deep venous thrombosis) (HCC)   . GERD (gastroesophageal reflux disease)   . Hypertension   . PE (pulmonary embolism)    Past Surgical History:  Procedure Laterality Date  . ABDOMINAL HYSTERECTOMY    . CHOLECYSTECTOMY    . KNEE ARTHROSCOPY Right    x 2  . KNEE SURGERY Left      A IV Location/Drains/Wounds Patient Lines/Drains/Airways Status   Active Line/Drains/Airways    Name:   Placement date:   Placement time:   Site:   Days:   Peripheral IV 06/03/2019 Left Antecubital   06/14/2019    0105    Antecubital   less than 1          Intake/Output Last 24 hours  Intake/Output Summary (Last 24 hours) at 06/07/2019 0516 Last data filed at 06/18/2019 8338 Gross per 24 hour  Intake 100 ml  Output --   Net 100 ml    Labs/Imaging Results for orders placed or performed during the hospital encounter of 06/15/2019 (from the past 48 hour(s))  Lipase, blood     Status: Abnormal   Collection Time: 05/20/19  7:20 PM  Result Value Ref Range   Lipase 1,352 (H) 11 - 51 U/L    Comment: RESULT CONFIRMED BY MANUAL DILUTION SRC/TIK Performed at [facility], [address] [place], [place] [address]   Comprehensive metabolic panel     Status: Abnormal   Collection Time: 05/20/19  7:20 PM  Result Value Ref Range   Sodium 140 135 - 145 mmol/L   Potassium 3.3 (L) 3.5 - 5.1 mmol/L   Chloride 105 98 - 111 mmol/L   CO2 20 (L) 22 - 32 mmol/L   Glucose, Bld 180 (H) 70 - 99 mg/dL   BUN 30 (H) 8 - 23 mg/dL   Creatinine, Ser 9.76 (H) 0.44 - 1.00 mg/dL   Calcium 9.8 8.9 - 73.4 mg/dL   Total Protein 8.2 (H) 6.5 - 8.1 g/dL   Albumin 3.9 3.5 - 5.0 g/dL   AST 47 (H) 15 - 41 U/L   ALT 27 0 - 44 U/L   Alkaline Phosphatase 50 38 - 126 U/L   Total Bilirubin 0.8 0.3 - 1.2 mg/dL   GFR calc non Af Amer 46 (L) >60 mL/min   GFR calc Af Amer 53 (L) >60 mL/min   Anion gap 15 5 - 15  Comment: Performed at [facility], [address] [place], [place] [address]  CBC     Status: None   Collection Time: 05/20/19  7:20 PM  Result Value Ref Range   WBC 10.5 4.0 - 10.5 K/uL   RBC 4.40 3.87 - 5.11 MIL/uL   Hemoglobin 14.5 12.0 - 15.0 g/dL   HCT 41.6 36.0 - 46.0 %   MCV 94.5 80.0 - 100.0 fL   MCH 33.0 26.0 - 34.0 pg   MCHC 34.9 30.0 - 36.0 g/dL   RDW 11.6 11.5 - 15.5 %   Platelets 191 150 - 400 K/uL   nRBC 0.0 0.0 - 0.2 %    Comment: Performed at [facility], [address], [place], [place] [address]  Troponin I (High Sensitivity)     Status: None   Collection Time: 05/20/19  7:20 PM  Result Value Ref Range   Troponin I (High Sensitivity) 11 <18 ng/L    Comment: (NOTE) Elevated high sensitivity troponin I (hsTnI) values and significant  changes across serial measurements may  suggest ACS but many other  chronic and acute conditions are known to elevate hsTnI results.  Refer to the "Links" section for chest pain algorithms and additional  guidance. Performed at [facility], [address] [place], [place] [address]   Urinalysis, Complete w Microscopic     Status: Abnormal   Collection Time: 05/24/2019  1:00 AM  Result Value Ref Range   Color, Urine YELLOW (A) YELLOW   APPearance CLOUDY (A) CLEAR   Specific Gravity, Urine 1.025 1.005 - 1.030   pH 5.5 5.0 - 8.0   Glucose, UA NEGATIVE NEGATIVE mg/dL   Hgb urine dipstick SMALL (A) NEGATIVE   Bilirubin Urine NEGATIVE NEGATIVE   Ketones, ur NEGATIVE NEGATIVE mg/dL   Protein, ur NEGATIVE NEGATIVE mg/dL   Nitrite NEGATIVE NEGATIVE   Leukocytes,Ua MODERATE (A) NEGATIVE   RBC / HPF 6-10 0 - 5 RBC/hpf   WBC, UA >50 (H) 0 - 5 WBC/hpf   Bacteria, UA FEW (A) NONE SEEN   Squamous Epithelial / LPF 0-5 0 - 5   WBC Clumps PRESENT    Mucus PRESENT     Comment: Performed at [facility], [address] [place], [place] [address]  Protime-INR     Status: Abnormal   Collection Time: 06/17/2019  1:30 AM  Result Value Ref Range   Prothrombin Time 25.8 (H) 11.4 - 15.2 seconds   INR 2.4 (H) 0.8 - 1.2    Comment: (NOTE) INR goal varies based on device and disease states. Performed at [facility], [address] [place], [place] [address]   CBC     Status: Abnormal   Collection Time: 05/24/2019  4:30 AM  Result Value Ref Range   WBC 11.1 (H) 4.0 - 10.5 K/uL   RBC 4.86 3.87 - 5.11 MIL/uL   Hemoglobin 15.9 (H) 12.0 - 15.0 g/dL   HCT 48.3 (H) 36.0 - 46.0 %   MCV 99.4 80.0 - 100.0 fL   MCH 32.7 26.0 - 34.0 pg   MCHC 32.9 30.0 - 36.0 g/dL   RDW 11.8 11.5 - 15.5 %   Platelets 194 150 - 400 K/uL   nRBC 0.0 0.0 - 0.2 %    Comment: Performed at [facility], [address] [place], [place] [address]   CT ABDOMEN PELVIS W CONTRAST  Result Date: 06/07/2019 CLINICAL DATA:  Epigastric  and upper abdominal pain for 2 hours.  EXAM: CT ABDOMEN AND PELVIS WITH CONTRAST TECHNIQUE: Multidetector CT imaging of the abdomen and pelvis was performed using the standard protocol following bolus administration of intravenous contrast. CONTRAST:  OMNIPAQUE IOHEXOL 300 MG/ML  SOLN COMPARISON:  CT 06/21/2013 FINDINGS: Lower chest: Bibasilar atelectasis, right greater than left. Suspected bibasilar bronchiectasis. No pleural fluid. Hepatobiliary: No focal hepatic abnormality. Postcholecystectomy. No biliary dilatation. No visualized choledocholithiasis. Pancreas: Moderate to severe peripancreatic fat stranding and free fluid about the uncinate process, head, and proximal body of the pancreas. Moderate non organized free fluid in the upper abdomen. No evidence of pancreatic necrosis. No organized fluid collection. No ductal dilatation. No visualized pancreatic mass. Spleen: Normal in size without focal abnormality. Adrenals/Urinary Tract: Normal adrenal glands. Multiple bilateral renal cysts. No hydronephrosis. No evidence of solid renal lesion. Homogeneous renal enhancement with symmetric excretion on delayed phase imaging. Urinary bladder is physiologically distended. No bladder wall thickening. Stomach/Bowel: Distended distal esophagus with intraluminal fluid. Fluid-filled distended stomach. No gastric wall thickening. Prominent fluid-filled duodenum without duodenal wall thickening. Proximal jejunum also fluid-filled. There is no small bowel wall thickening or inflammation. No obstruction. Normal appendix. Small volume of stool in the colon. No colonic wall thickening. Vascular/Lymphatic: Prominent portal caval and peripancreatic nodes, all subcentimeter. Portal vein is patent. Mesenteric vessels are patent. Aortic atherosclerosis without aneurysm. Reproductive: Pessary in place. Hysterectomy. No adnexal mass. Other: Moderate volume of free fluid in the upper abdomen about the pancreas, duodenum, and  tracking in the right upper quadrant. Small amount of free fluid in the right pericolic gutter and pelvis. No free air. No organized collection. Musculoskeletal: Multilevel degenerative change in the spine. There are no acute or suspicious osseous abnormalities. IMPRESSION: 1. Acute pancreatitis with moderate to severe peripancreatic fat stranding and free fluid. No evidence of pancreatic necrosis or organized fluid collection. 2. Fluid-filled distended distal esophagus, stomach and proximal small bowel, likely secondary to pancreatitis. Aortic Atherosclerosis (ICD10-I70.0). Electronically Signed   By: Narda Rutherford M.D.   On: 05/27/2019 01:59    Pending Labs Unresulted Labs (From admission, onward)    Start     Ordered   05/22/19 0500  Protime-INR  Daily,   STAT     05/19/2019 0341   06/06/2019 0500  Basic metabolic panel  Tomorrow morning,   STAT     05/24/2019 0331   05/25/2019 0500  Lipase, blood  Daily,   STAT     05/22/2019 0331   05/28/2019 0242  Urine culture  Add-on,   AD     06/09/2019 0241   06/12/2019 0224  SARS CORONAVIRUS 2 (TAT 6-24 HRS) Nasopharyngeal Nasopharyngeal Swab  (Tier 3 (TAT 6-24 hrs))  Once,   STAT    Question Answer Comment  Is this test for diagnosis or screening Screening   Symptomatic for COVID-19 as defined by CDC No   Hospitalized for COVID-19 No   Admitted to ICU for COVID-19 No   Previously tested for COVID-19 Yes   Resident in a congregate (group) care setting No   Employed in healthcare setting No   Pregnant No      06/15/2019 0223          Vitals/Pain Today's Vitals   06/09/2019 0207 06/13/2019 0310 05/28/2019 0400 05/26/2019 0420  BP:  (!) 158/80 (!) 156/83   Pulse:  95 (!) 101   Resp:  14 (!) 26   Temp:  98.7 F (37.1 C)    TempSrc:  Oral    SpO2:  99% 100%  Weight:      Height:      PainSc: 8  8   3      Isolation Precautions No active isolations  Medications Medications  lisinopril (ZESTRIL) tablet 10 mg (has no administration in time range)   triamterene-hydrochlorothiazide (MAXZIDE-25) 37.5-25 MG per tablet 1 tablet (has no administration in time range)  pantoprazole (PROTONIX) EC tablet 40 mg (has no administration in time range)  conjugated estrogens (PREMARIN) vaginal cream 1 Applicatorful (has no administration in time range)  warfarin (COUMADIN) tablet 4 mg (has no administration in time range)  Calcium Carbonate-Vitamin D3 600-400 MG-UNIT TABS 1 tablet (has no administration in time range)  multivitamin with minerals tablet 1 tablet (has no administration in time range)  enoxaparin (LOVENOX) injection 40 mg (40 mg Subcutaneous Given 06/07/2019 0426)  0.9 %  sodium chloride infusion ( Intravenous New Bag/Given 05/20/2019 0426)  acetaminophen (TYLENOL) tablet 650 mg (has no administration in time range)    Or  acetaminophen (TYLENOL) suppository 650 mg (has no administration in time range)  traZODone (DESYREL) tablet 25 mg (has no administration in time range)  magnesium hydroxide (MILK OF MAGNESIA) suspension 30 mL (has no administration in time range)  ondansetron (ZOFRAN) tablet 4 mg (has no administration in time range)    Or  ondansetron (ZOFRAN) injection 4 mg (has no administration in time range)  cefTRIAXone (ROCEPHIN) 1 g in sodium chloride 0.9 % 100 mL IVPB (has no administration in time range)  ondansetron (ZOFRAN) injection 4 mg (4 mg Intravenous Given 05/22/2019 0134)  sodium chloride 0.9 % bolus 1,000 mL (1,000 mLs Intravenous New Bag/Given 06/09/2019 0134)  fentaNYL (SUBLIMAZE) injection 50 mcg (50 mcg Intravenous Given 06/17/2019 0120)  iohexol (OMNIPAQUE) 300 MG/ML solution 100 mL (100 mLs Intravenous Contrast Given 06/01/2019 0150)  morphine 4 MG/ML injection 4 mg (4 mg Intravenous Given 06/09/2019 0236)  cefTRIAXone (ROCEPHIN) 1 g in sodium chloride 0.9 % 100 mL IVPB (0 g Intravenous Stopped 06/08/2019 0412)    Mobility walks Low fall risk   Focused Assessments Renal Assessment Handoff:  , Pulmonary Assessment Handoff:  Lung  sounds:   O2 Device: Room Air        R Recommendations: See Admitting Provider Note  Report given to:   Additional Notes:

## 2019-05-21 NOTE — Progress Notes (Signed)
PROGRESS NOTE    [name]  RWE:[id number] DOB: [birth year] DOA: 06/11/2019 PCP: Sherrie Mustache, MD      Assessment & Plan:   Active Problems:   Acute pancreatitis   Acute pancreatitis: possibly related to HCTZ with likely associated mild ileus. TG 61. NPO. Continue on IVFs.  Fentanyl prn for pain. Lipase is still elevated but trending down. D/c HCTZ in [place].    Likely ileus: NPO. Continue on IVFs. Dulcolax supp x 1. May need NG tube if dulcolax does not resolve it   Leukocytosis: likely secondary to infection. Continue on abxs  Suspected UTI: urine cx is pending. Continue on IV ceftriaxone  Elevated troponins: likely secondary to demand ischemia. No acute ischemic EKG changes. No CP.   Hyperglycemia: no hx of DM. Will check HbA1c. Will continue to monitor   Hypokalemia: KCl repleated. 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 Keep INR between 2-3.   Hypertension: continue lisinopril and stop HCTZ in [place].  GERD: continue on IV pepcid    DVT prophylaxis: coumadin Code Status:  full Family Communication: discussed pt's care w/ pt's daughter who is at bedside and answered all of her questions Disposition Plan:   Consultants:   none   Procedures:   Antimicrobials: ceftriaxone   Subjective: Pt c/o abd pain   Objective: Vitals:   06/06/2019 0106 06/06/2019 0310 06/16/2019 0400 06/16/2019 0622  BP:  (!) 158/80 (!) 156/83 126/88  Pulse:  95 (!) 101 (!) 109  Resp:  14 (!) 26 17  Temp: 98.3 F (36.8 C) 98.7 F (37.1 C)  99 F (37.2 C)  TempSrc: Oral Oral    SpO2:  99% 100% 98%  Weight:      Height:        Intake/Output Summary (Last 24 hours) at 06/03/2019 0754 Last data filed at 06/03/2019 0412 Gross per 24 hour  Intake 100 ml  Output --  Net 100 ml   Filed Weights   05/20/19 1909  Weight: 77.1 kg    Examination:  General exam: Appears calm but uncomfortable  Respiratory system: Clear to auscultation.  Respiratory effort normal. Cardiovascular system: S1 & S2 +. No , rubs, gallops or clicks. No pedal edema. Gastrointestinal system: Abdomen is mildly distended, soft and tenderness to palpation. Hypoactive bowel sounds heard. Central nervous system: Alert and oriented. Moves all 4 extremities. Psychiatry: Judgement and insight appear normal. Flat mood and affect     Data Reviewed: I have personally reviewed following labs and imaging studies  CBC: Recent Labs  Lab 05/20/19 1920 06/06/2019 0430  WBC 10.5 11.1*  HGB 14.5 15.9*  HCT 41.6 48.3*  MCV 94.5 99.4  PLT 191 194   Basic Metabolic Panel: Recent Labs  Lab 05/20/19 1920 06/07/2019 0430  NA 140 142  K 3.3* 3.2*  CL 105 111  CO2 20* 19*  GLUCOSE 180* 153*  BUN 30* 28*  CREATININE 1.13* 1.09*  CALCIUM 9.8 7.6*   GFR: Estimated Creatinine Clearance: 39.6 mL/min (A) (by C-G formula based on SCr of 1.09 mg/dL (H)). Liver Function Tests: Recent Labs  Lab 05/20/19 1920  AST 47*  ALT 27  ALKPHOS 50  BILITOT 0.8  PROT 8.2*  ALBUMIN 3.9   Recent Labs  Lab 05/20/19 1920 05/26/2019 0430  LIPASE 1,352* 570*   No results for input(s): AMMONIA in the last 168 hours. Coagulation Profile: Recent Labs  Lab 05/20/2019 0130  INR 2.4*   Cardiac Enzymes: No  results for input(s): CKTOTAL, CKMB, CKMBINDEX, TROPONINI in the last 168 hours. BNP (last 3 results) No results for input(s): PROBNP in the last 8760 hours. HbA1C: No results for input(s): HGBA1C in the last 72 hours. CBG: No results for input(s): GLUCAP in the last 168 hours. Lipid Profile: No results for input(s): CHOL, HDL, LDLCALC, TRIG, CHOLHDL, LDLDIRECT in the last 72 hours. Thyroid Function Tests: No results for input(s): TSH, T4TOTAL, FREET4, T3FREE, THYROIDAB in the last 72 hours. Anemia Panel: No results for input(s): VITAMINB12, FOLATE, FERRITIN, TIBC, IRON, RETICCTPCT in the last 72 hours. Sepsis Labs: No results for input(s): PROCALCITON, LATICACIDVEN  in the last 168 hours.  No results found for this or any previous visit (from the past 240 hour(s)).       Radiology Studies: CT ABDOMEN PELVIS W CONTRAST  Result Date: 06/01/2019 CLINICAL DATA:  Epigastric and upper abdominal pain for 2 hours. EXAM: CT ABDOMEN AND PELVIS WITH CONTRAST TECHNIQUE: Multidetector CT imaging of the abdomen and pelvis was performed using the standard protocol following bolus administration of intravenous contrast. CONTRAST:  136mL OMNIPAQUE IOHEXOL 300 MG/ML  SOLN COMPARISON:  CT 06/21/2013 FINDINGS: Lower chest: Bibasilar atelectasis, right greater than left. Suspected bibasilar bronchiectasis. No pleural fluid. Hepatobiliary: No focal hepatic abnormality. Postcholecystectomy. No biliary dilatation. No visualized choledocholithiasis. Pancreas: Moderate to severe peripancreatic fat stranding and free fluid about the uncinate process, head, and proximal body of the pancreas. Moderate non organized free fluid in the upper abdomen. No evidence of pancreatic necrosis. No organized fluid collection. No ductal dilatation. No visualized pancreatic mass. Spleen: Normal in size without focal abnormality. Adrenals/Urinary Tract: Normal adrenal glands. Multiple bilateral renal cysts. No hydronephrosis. No evidence of solid renal lesion. Homogeneous renal enhancement with symmetric excretion on delayed phase imaging. Urinary bladder is physiologically distended. No bladder wall thickening. Stomach/Bowel: Distended distal esophagus with intraluminal fluid. Fluid-filled distended stomach. No gastric wall thickening. Prominent fluid-filled duodenum without duodenal wall thickening. Proximal jejunum also fluid-filled. There is no small bowel wall thickening or inflammation. No obstruction. Normal appendix. Small volume of stool in the colon. No colonic wall thickening. Vascular/Lymphatic: Prominent portal caval and peripancreatic nodes, all subcentimeter. Portal vein is patent. Mesenteric  vessels are patent. Aortic atherosclerosis without aneurysm. Reproductive: Pessary in place. Hysterectomy. No adnexal mass. Other: Moderate volume of free fluid in the upper abdomen about the pancreas, duodenum, and tracking in the right upper quadrant. Small amount of free fluid in the right pericolic gutter and pelvis. No free air. No organized collection. Musculoskeletal: Multilevel degenerative change in the spine. There are no acute or suspicious osseous abnormalities. IMPRESSION: 1. Acute pancreatitis with moderate to severe peripancreatic fat stranding and free fluid. No evidence of pancreatic necrosis or organized fluid collection. 2. Fluid-filled distended distal esophagus, stomach and proximal small bowel, likely secondary to pancreatitis. Aortic Atherosclerosis (ICD10-I70.0). Electronically Signed   By: Keith Rake M.D.   On: 06/09/2019 01:59        Scheduled Meds: . calcium-vitamin D  1 tablet Oral Daily  . [START ON 05/22/2019] conjugated estrogens  1 Applicatorful Vaginal Once per day on Mon Thu  . enoxaparin (LOVENOX) injection  40 mg Subcutaneous Q24H  . lisinopril  10 mg Oral Daily  . multivitamin with minerals  1 tablet Oral Daily  . pantoprazole  40 mg Oral Daily  . warfarin  4 mg Oral QPM  . Warfarin - Pharmacist Dosing Inpatient   Does not apply q1800   Continuous Infusions: . sodium chloride 100 mL/hr  at June 05, 2019 0426  . [START ON 05/22/2019] cefTRIAXone (ROCEPHIN)  IV    . famotidine (PEPCID) IV    . potassium chloride       LOS: 0 days    Time spent: 34 mins    Charise Killian, MD [facility] Pager [phone]  If 7PM-7AM, please contact night-coverage [email or web address] Password [facility] June 05, 2019, 7:54 AM

## 2019-05-21 NOTE — ED Notes (Signed)
Pt reports no change to nausea

## 2019-05-21 NOTE — Significant Event (Signed)
Rapid Response Event Note  Overview: Time Called: 1414 Arrival Time: 1417 Event Type: Respiratory, Cardiac, Hypotension  Initial Focused Assessment: called RR for multiple medical complaints. Pt laying in bed, cool, clammy skin noted. BP soft, 02 sats 94% on 4lNC, ST on tele box. Noted pt just back to bed from bathroom, suspepected ileus on XRay.   Interventions: Dr Mayford Knife at bedside. NGT placed to intermittent suction, Foley for strict I and o, and 1000 ml NS bolus for soft BP. CXR and Abd Xray also ordered.   Plan of Care (if not transferred): Reeves Dam RN to call if further assistance needed. Event Summary: Name of Physician Notified: Wilfred Lacy, MD at 1425    at    Outcome: Stayed in room and stabalized  Event End Time: 1500  Kimorah Ridolfi A

## 2019-05-21 NOTE — Progress Notes (Signed)
CRITICAL VALUE ALERT  Critical Value:  Troponin 110  Date & Time Notied:  06/06/2019 @ 1402  Provider Notified: Fabienne Bruns, MD  Orders Received/Actions taken: no new orders

## 2019-05-21 NOTE — ED Notes (Signed)
Assigned bed is unavailable, clarifying bed status now

## 2019-05-21 NOTE — Progress Notes (Signed)
ANTICOAGULATION CONSULT NOTE - Initial Consult  Pharmacy Consult for warfarin monitoring Indication: VTE prophylaxis; patient w/ h/o DVT/PE  Allergies  Allergen Reactions  . Percocet [Oxycodone-Acetaminophen] Itching  . Sulfa Antibiotics Rash    Patient Measurements: Height: 5\' 2"  (157.5 cm) Weight: 170 lb (77.1 kg) IBW/kg (Calculated) : 50.1  Vital Signs: Temp: 99 F (37.2 C) (01/03 0622) Temp Source: Oral (01/03 0310) BP: 126/88 (01/03 0622) Pulse Rate: 109 (01/03 0622)  Labs: Recent Labs    05/20/19 1920 06/16/2019 0130 05/24/2019 0430  HGB 14.5  --  15.9*  HCT 41.6  --  48.3*  PLT 191  --  194  LABPROT  --  25.8*  --   INR  --  2.4*  --   CREATININE 1.13*  --  1.09*  TROPONINIHS 11  --  30*    Estimated Creatinine Clearance: 39.6 mL/min (A) (by C-G formula based on SCr of 1.09 mg/dL (H)).   Medical History: Past Medical History:  Diagnosis Date  . DVT (deep venous thrombosis) (HCC)   . GERD (gastroesophageal reflux disease)   . Hypertension   . PE (pulmonary embolism)     Medications:  Scheduled:  . calcium-vitamin D  1 tablet Oral Daily  . [START ON 05/22/2019] conjugated estrogens  1 Applicatorful Vaginal Once per day on Mon Thu  . enoxaparin (LOVENOX) injection  40 mg Subcutaneous Q24H  . lisinopril  10 mg Oral Daily  . multivitamin with minerals  1 tablet Oral Daily  . pantoprazole  40 mg Oral Daily  . warfarin  4 mg Oral QPM  . Warfarin - Pharmacist Dosing Inpatient   Does not apply q1800    Assessment: Patient arrives w/ c/o abdominal pain s/t acute pancreatitis on CT. Patient also w/ h/o DVT/PE anticoagulated w/ warfarin 4 mg daily PTA.  01/03 INR 2.4  Goal of Therapy:  INR 2-3 Monitor platelets by anticoagulation protocol: Yes   Plan:  Will continue patient's current home warfarin dose of 4 mg daily. Will continue to monitor daily INRs and adjust per INR trends. Will monitor CBC's as well to ensure patient not having bleeding  complications.  03/03, PharmD, BCPS Clinical Pharmacist 06/09/2019,6:33 AM

## 2019-05-21 NOTE — ED Provider Notes (Signed)
[facility] Emergency Department Provider Note  ____________________________________________  Time seen: Approximately 12:47 AM  I have reviewed the triage vital signs and the nursing notes.   HISTORY  Chief Complaint Abdominal Pain   HPI [name] is a 79 y.o. female with a history of PE/DVT on Coumadin, GERD, hypertension, cholecystectomy, hysterectomy who presents for evaluation of abdominal pain.  Pain started after lunch at 1:00.  Pain is severe, sharp, epigastric, constant and nonradiating, associated with several episodes of nonbloody nonbilious emesis.  No fever or chills, no chest pain or shortness of breath, no dysuria or hematuria, no diarrhea or constipation.  Patient denies ever having similar pain.   Past Medical History:  Diagnosis Date  . DVT (deep venous thrombosis) ([place])   . GERD (gastroesophageal reflux disease)   . Hypertension   . PE (pulmonary embolism)     Patient Active Problem List   Diagnosis Date Noted  . Acute pancreatitis 05/20/2019  . Baden-Walker grade 3 cystocele 01/22/2017  . Urinary frequency 01/22/2017  . Vaginal atrophy 01/22/2017  . Chronic bilateral low back pain without sciatica 01/22/2017  . Nocturia 01/22/2017    Past Surgical History:  Procedure Laterality Date  . ABDOMINAL HYSTERECTOMY    . CHOLECYSTECTOMY    . KNEE ARTHROSCOPY Right    x 2  . KNEE SURGERY Left     Prior to Admission medications   Medication Sig Start Date End Date Taking? Authorizing Provider  acetaminophen (TYLENOL) 500 MG tablet Take 500 mg by mouth every 6 (six) hours as needed for mild pain or fever.     [provider]  Calcium Carbonate-Vitamin D 600-400 MG-UNIT tablet Take 1 tablet by mouth daily.    [provider]  Calcium Carbonate-Vitamin D3 (CALCIUM 600-D) 600-400 MG-UNIT TABS Take 1 tablet by mouth daily.    [provider]  conjugated estrogens (PREMARIN) vaginal cream Place 1  Applicatorful vaginally 2 (two) times a week. Use 1/2 gram twice weekly 01/23/19   Rubie Maid, MD  lisinopril (PRINIVIL,ZESTRIL) 10 MG tablet Take 10 mg by mouth daily.    [provider]  Multiple Vitamins-Minerals (CENTRUM SILVER 50+WOMEN) TABS Take 1 tablet by mouth daily.    [provider]  omeprazole (PRILOSEC) 20 MG capsule Take 20 mg by mouth daily.    [provider]  triamterene-hydrochlorothiazide (MAXZIDE-25) 37.5-25 MG tablet Take 1 tablet by mouth daily.     [provider]  warfarin (COUMADIN) 4 MG tablet Take 4 mg by mouth every evening.     [provider]    Allergies Percocet [oxycodone-acetaminophen] and Sulfa antibiotics  Family History  Problem Relation Age of Onset  . Breast cancer Paternal Aunt 81  . Kidney cancer Neg Hx   . Bladder Cancer Neg Hx     Social History Social History   Tobacco Use  . Smoking status: Never Smoker  . Smokeless tobacco: Never Used  Substance Use Topics  . Alcohol use: No  . Drug use: No    Review of Systems  Constitutional: Negative for fever. Eyes: Negative for visual changes. ENT: Negative for sore throat. Neck: No neck pain  Cardiovascular: Negative for chest pain. Respiratory: Negative for shortness of breath. Gastrointestinal: +abdominal pain, nausea, and vomiting. No diarrhea. Genitourinary: Negative for dysuria. Musculoskeletal: Negative for back pain. Skin: Negative for rash. Neurological: Negative for headaches, weakness or numbness. Psych: No SI or HI  ____________________________________________   PHYSICAL EXAM:  VITAL SIGNS: ED Triage  Vitals  Enc Vitals Group     BP 05/20/19 1910 (!) 167/67     Pulse Rate 05/20/19 1910 78     Resp 05/20/19 1910 18     Temp 05/20/19 1910 97.9 F (36.6 C)     Temp Source 05/20/19 1910 Oral     SpO2 05/20/19 1910 98 %     Weight 05/20/19 1909 170 lb (77.1 kg)     Height 05/20/19 1909 _0  (1.575 m)     Head  Circumference --      Peak Flow --      Pain Score --      Pain Loc --      Pain Edu? --      Excl. in [place]? --     Constitutional: Alert and oriented, actively vomiting, looks uncomfortable.  HEENT:      Head: Normocephalic and atraumatic.         Eyes: Conjunctivae are normal. Sclera is non-icteric.       Mouth/Throat: Mucous membranes are moist.       Neck: Supple with no signs of meningismus. Cardiovascular: Regular rate and rhythm. No murmurs, gallops, or rubs. 2+ symmetrical distal pulses are present in all extremities. No JVD. Respiratory: Normal respiratory effort. Lungs are clear to auscultation bilaterally. No wheezes, crackles, or rhonchi.  Gastrointestinal: Soft, tender to palpation epigastric region with localized guarding, and non distended with positive bowel sounds. No rebound Musculoskeletal: Nontender with normal range of motion in all extremities. No edema, cyanosis, or erythema of extremities. Neurologic: Normal speech and language. Face is symmetric. Moving all extremities. No gross focal neurologic deficits are appreciated. Skin: Skin is warm, dry and intact. No rash noted. Psychiatric: Mood and affect are normal. Speech and behavior are normal.  ____________________________________________   LABS (all labs ordered are listed, but only abnormal results are displayed)  Labs Reviewed  LIPASE, BLOOD - Abnormal; Notable for the following components:      Result Value   Lipase 1,352 (*)    All other components within normal limits  COMPREHENSIVE METABOLIC PANEL - Abnormal; Notable for the following components:   Potassium 3.3 (*)    CO2 20 (*)    Glucose, Bld 180 (*)    BUN 30 (*)    Creatinine, Ser 1.13 (*)    Total Protein 8.2 (*)    AST 47 (*)    GFR calc non Af Amer 46 (*)    GFR calc Af Amer 53 (*)    All other components within normal limits  URINALYSIS, COMPLETE (UACMP) WITH MICROSCOPIC - Abnormal; Notable for the following components:   Color, Urine  YELLOW (*)    APPearance CLOUDY (*)    Hgb urine dipstick SMALL (*)    Leukocytes,Ua MODERATE (*)    WBC, UA >50 (*)    Bacteria, UA FEW (*)    All other components within normal limits  PROTIME-INR - Abnormal; Notable for the following components:   Prothrombin Time 25.8 (*)    INR 2.4 (*)    All other components within normal limits  SARS CORONAVIRUS 2 (TAT 6-24 HRS)  URINE CULTURE  CBC  BASIC METABOLIC PANEL  CBC  LIPASE, BLOOD  TROPONIN I (HIGH SENSITIVITY)  TROPONIN I (HIGH SENSITIVITY)   ____________________________________________  EKG  ED ECG REPORT I, Rudene Re, the attending physician, personally viewed and interpreted this ECG.  Normal sinus rhythm, rate of 75, normal intervals, normal axis, no ST elevations or depressions, nonspecific  ST abnormalities.  Unchanged from prior. ____________________________________________  RADIOLOGY  I have personally reviewed the images performed during this visit and I agree with the Radiologist's read.   Interpretation by Radiologist:  CT ABDOMEN PELVIS W CONTRAST  Result Date: 06/06/2019 CLINICAL DATA:  Epigastric and upper abdominal pain for 2 hours. EXAM: CT ABDOMEN AND PELVIS WITH CONTRAST TECHNIQUE: Multidetector CT imaging of the abdomen and pelvis was performed using the standard protocol following bolus administration of intravenous contrast. CONTRAST:  183m OMNIPAQUE IOHEXOL 300 MG/ML  SOLN COMPARISON:  CT 06/21/2013 FINDINGS: Lower chest: Bibasilar atelectasis, right greater than left. Suspected bibasilar bronchiectasis. No pleural fluid. Hepatobiliary: No focal hepatic abnormality. Postcholecystectomy. No biliary dilatation. No visualized choledocholithiasis. Pancreas: Moderate to severe peripancreatic fat stranding and free fluid about the uncinate process, head, and proximal body of the pancreas. Moderate non organized free fluid in the upper abdomen. No evidence of pancreatic necrosis. No organized fluid  collection. No ductal dilatation. No visualized pancreatic mass. Spleen: Normal in size without focal abnormality. Adrenals/Urinary Tract: Normal adrenal glands. Multiple bilateral renal cysts. No hydronephrosis. No evidence of solid renal lesion. Homogeneous renal enhancement with symmetric excretion on delayed phase imaging. Urinary bladder is physiologically distended. No bladder wall thickening. Stomach/Bowel: Distended distal esophagus with intraluminal fluid. Fluid-filled distended stomach. No gastric wall thickening. Prominent fluid-filled duodenum without duodenal wall thickening. Proximal jejunum also fluid-filled. There is no small bowel wall thickening or inflammation. No obstruction. Normal appendix. Small volume of stool in the colon. No colonic wall thickening. Vascular/Lymphatic: Prominent portal caval and peripancreatic nodes, all subcentimeter. Portal vein is patent. Mesenteric vessels are patent. Aortic atherosclerosis without aneurysm. Reproductive: Pessary in place. Hysterectomy. No adnexal mass. Other: Moderate volume of free fluid in the upper abdomen about the pancreas, duodenum, and tracking in the right upper quadrant. Small amount of free fluid in the right pericolic gutter and pelvis. No free air. No organized collection. Musculoskeletal: Multilevel degenerative change in the spine. There are no acute or suspicious osseous abnormalities. IMPRESSION: 1. Acute pancreatitis with moderate to severe peripancreatic fat stranding and free fluid. No evidence of pancreatic necrosis or organized fluid collection. 2. Fluid-filled distended distal esophagus, stomach and proximal small bowel, likely secondary to pancreatitis. Aortic Atherosclerosis (ICD10-I70.0). Electronically Signed   By: MKeith RakeM.D.   On: 06/12/2019 01:59      ____________________________________________   PROCEDURES  Procedure(s) performed: None Procedures Critical Care performed:   None ____________________________________________   INITIAL IMPRESSION / ASSESSMENT AND PLAN / ED COURSE   79y.o. female with a history of PE/DVT on Coumadin, GERD, hypertension, cholecystectomy, hysterectomy who presents for evaluation of epigastric abdominal pain, nausea and vomiting.  Patient looks uncomfortable and actively vomiting.  She is tender to palpation epigastric region with localized guarding but no rebound.  Vitals are within normal limits with no fever.  EKG showing no acute ischemic changes.  Differential diagnosis including pancreatitis versus gastritis versus peptic ulcer disease versus SBO versus diverticulitis versus ACS.  Labs are consistent with pancreatitis with a lipase of 1352.  No significant electrolyte derangements, normal T bili and alk phos.  No white count.  Mild hyperglycemia with glucose of 180.  Per Ranson criteria patient is low risk.  We will send patient for CT abdomen pelvis to rule out retained stone versus malignancy versus any complications of pancreatitis including abscess or necrosis or pseudocyst.  Will treat symptoms with fentanyl, Zofran and fluids.  We will reassess after CT    _________________________ 3:38 AM on  05/24/2019 -----------------------------------------  CT showing severe peripancreatic fat stranding and free fluid.  Patient requiring several rounds of IV narcotics for pain control. Based on patient's age, CT findings, and severe pain, patient will be admitted to the hospitalist service.    As part of my medical decision making, I reviewed the following data within the [place] notes reviewed and incorporated, Labs reviewed , EKG interpreted , Old chart reviewed, Radiograph reviewed , Discussed with admitting physician , Notes from prior ED visits and [place] Controlled Substance Database   Please note:  Patient was evaluated in Emergency Department today for the symptoms described in the history of present  illness. Patient was evaluated in the context of the global COVID-19 pandemic, which necessitated consideration that the patient might be at risk for infection with the SARS-CoV-2 virus that causes COVID-19. Institutional protocols and algorithms that pertain to the evaluation of patients at risk for COVID-19 are in a state of rapid change based on information released by regulatory bodies including the CDC and federal and state organizations. These policies and algorithms were followed during the patient's care in the ED.  Some ED evaluations and interventions may be delayed as a result of limited staffing during the pandemic.   ____________________________________________   FINAL CLINICAL IMPRESSION(S) / ED DIAGNOSES   Final diagnoses:  Acute pancreatitis, unspecified complication status, unspecified pancreatitis type  Acute cystitis with hematuria      NEW MEDICATIONS STARTED DURING THIS VISIT:  ED Discharge Orders    None       Note:  This document was prepared using Dragon voice recognition software and may include unintentional dictation errors.    Alfred Levins, [place], MD 05/25/2019 [phone]

## 2019-05-21 NOTE — ED Notes (Signed)
Date and time results received: 06-07-19 0530 (use smartphrase ".now" to insert current time)  Test: troponin Critical Value: 30  Name of Provider Notified: Jon Billings, NP via secure chat  Orders Received? Or Actions Taken?: awaiting orders

## 2019-05-21 NOTE — Progress Notes (Signed)
Responded to Rapid.  HR 112 oxygen saturations 88% on 2lpm increased oxygen to 4lpm and saturations increased to 94%.  Informed CCU charge nurse who was present in the room to call if my services were further needed.

## 2019-05-21 NOTE — ED Notes (Signed)
Pt to ct 

## 2019-05-22 ENCOUNTER — Inpatient Hospital Stay: Payer: Medicare HMO

## 2019-05-22 DIAGNOSIS — N390 Urinary tract infection, site not specified: Secondary | ICD-10-CM

## 2019-05-22 DIAGNOSIS — R571 Hypovolemic shock: Secondary | ICD-10-CM

## 2019-05-22 DIAGNOSIS — K8501 Idiopathic acute pancreatitis with uninfected necrosis: Secondary | ICD-10-CM

## 2019-05-22 DIAGNOSIS — E861 Hypovolemia: Secondary | ICD-10-CM

## 2019-05-22 DIAGNOSIS — K8531 Drug induced acute pancreatitis with uninfected necrosis: Secondary | ICD-10-CM

## 2019-05-22 DIAGNOSIS — I878 Other specified disorders of veins: Secondary | ICD-10-CM

## 2019-05-22 LAB — BASIC METABOLIC PANEL
Anion gap: 17 — ABNORMAL HIGH (ref 5–15)
Anion gap: 18 — ABNORMAL HIGH (ref 5–15)
BUN: 51 mg/dL — ABNORMAL HIGH (ref 8–23)
BUN: 58 mg/dL — ABNORMAL HIGH (ref 8–23)
CO2: 12 mmol/L — ABNORMAL LOW (ref 22–32)
CO2: 10 mmol/L — ABNORMAL LOW (ref 22–32)
Calcium: 8.6 mg/dL — ABNORMAL LOW (ref 8.9–10.3)
Calcium: 7.8 mg/dL — ABNORMAL LOW (ref 8.9–10.3)
Chloride: 113 mmol/L — ABNORMAL HIGH (ref 98–111)
Chloride: 114 mmol/L — ABNORMAL HIGH (ref 98–111)
Creatinine, Ser: 3.51 mg/dL — ABNORMAL HIGH (ref 0.44–1.00)
Creatinine, Ser: 3.89 mg/dL — ABNORMAL HIGH (ref 0.44–1.00)
GFR calc Af Amer: 14 mL/min — ABNORMAL LOW (ref >60)
GFR calc Af Amer: 12 mL/min — ABNORMAL LOW (ref >60)
GFR calc non Af Amer: 12 mL/min — ABNORMAL LOW (ref >60)
GFR calc non Af Amer: 10 mL/min — ABNORMAL LOW (ref >60)
Glucose, Bld: 82 mg/dL (ref 70–99)
Glucose, Bld: 69 mg/dL — ABNORMAL LOW (ref 70–99)
Potassium: 5.4 mmol/L — ABNORMAL HIGH (ref 3.5–5.1)
Potassium: 5.4 mmol/L — ABNORMAL HIGH (ref 3.5–5.1)
Sodium: 142 mmol/L (ref 135–145)
Sodium: 142 mmol/L (ref 135–145)

## 2019-05-22 LAB — MRSA PCR SCREENING: MRSA by PCR: NEGATIVE

## 2019-05-22 LAB — LACTIC ACID, PLASMA
Lactic Acid, Venous: 7.7 mmol/L (ref 0.5–1.9)
Lactic Acid, Venous: 7 mmol/L (ref 0.5–1.9)
Lactic Acid, Venous: 6.8 mmol/L (ref 0.5–1.9)

## 2019-05-22 LAB — BLOOD GAS, ARTERIAL
Acid-base deficit: 16.3 mmol/L — ABNORMAL HIGH (ref 0.0–2.0)
Bicarbonate: 10.9 mmol/L — ABNORMAL LOW (ref 20.0–28.0)
FIO2: 1
O2 Saturation: 34.8 %
Patient temperature: 37
pCO2 arterial: 30 mmHg — ABNORMAL LOW (ref 32.0–48.0)
pH, Arterial: 7.17 — CL (ref 7.350–7.450)
pO2, Arterial: <31 mmHg — CL (ref 83.0–108.0)

## 2019-05-22 LAB — GLUCOSE, CAPILLARY
Glucose-Capillary: 143 mg/dL — ABNORMAL HIGH (ref 70–99)
Glucose-Capillary: 64 mg/dL — ABNORMAL LOW (ref 70–99)
Glucose-Capillary: 109 mg/dL — ABNORMAL HIGH (ref 70–99)
Glucose-Capillary: 96 mg/dL (ref 70–99)
Glucose-Capillary: 82 mg/dL (ref 70–99)

## 2019-05-22 LAB — RENAL FUNCTION PANEL
Albumin: 2.1 g/dL — ABNORMAL LOW (ref 3.5–5.0)
Anion gap: 15 (ref 5–15)
BUN: 61 mg/dL — ABNORMAL HIGH (ref 8–23)
CO2: 13 mmol/L — ABNORMAL LOW (ref 22–32)
Calcium: 7.5 mg/dL — ABNORMAL LOW (ref 8.9–10.3)
Chloride: 116 mmol/L — ABNORMAL HIGH (ref 98–111)
Creatinine, Ser: 3.86 mg/dL — ABNORMAL HIGH (ref 0.44–1.00)
GFR calc Af Amer: 12 mL/min — ABNORMAL LOW (ref >60)
GFR calc non Af Amer: 10 mL/min — ABNORMAL LOW (ref >60)
Glucose, Bld: 133 mg/dL — ABNORMAL HIGH (ref 70–99)
Phosphorus: 5.3 mg/dL — ABNORMAL HIGH (ref 2.5–4.6)
Potassium: 5.2 mmol/L — ABNORMAL HIGH (ref 3.5–5.1)
Sodium: 144 mmol/L (ref 135–145)

## 2019-05-22 LAB — CBC
HCT: 46.5 % — ABNORMAL HIGH (ref 36.0–46.0)
Hemoglobin: 15.2 g/dL — ABNORMAL HIGH (ref 12.0–15.0)
MCH: 32.7 pg (ref 26.0–34.0)
MCHC: 32.7 g/dL (ref 30.0–36.0)
MCV: 100 fL (ref 80.0–100.0)
Platelets: 169 10*3/uL (ref 150–400)
RBC: 4.65 MIL/uL (ref 3.87–5.11)
RDW: 12.7 % (ref 11.5–15.5)
WBC: 10.6 10*3/uL — ABNORMAL HIGH (ref 4.0–10.5)
nRBC: 0 % (ref 0.0–0.2)

## 2019-05-22 LAB — PROTIME-INR
INR: 3.6 — ABNORMAL HIGH (ref 0.8–1.2)
Prothrombin Time: 35.5 seconds — ABNORMAL HIGH (ref 11.4–15.2)

## 2019-05-22 LAB — HEPATIC FUNCTION PANEL
ALT: 79 U/L — ABNORMAL HIGH (ref 0–44)
AST: 243 U/L — ABNORMAL HIGH (ref 15–41)
Albumin: 2.3 g/dL — ABNORMAL LOW (ref 3.5–5.0)
Alkaline Phosphatase: 96 U/L (ref 38–126)
Bilirubin, Direct: 0.6 mg/dL — ABNORMAL HIGH (ref 0.0–0.2)
Indirect Bilirubin: 0.6 mg/dL (ref 0.3–0.9)
Total Bilirubin: 1.2 mg/dL (ref 0.3–1.2)
Total Protein: 5.2 g/dL — ABNORMAL LOW (ref 6.5–8.1)

## 2019-05-22 LAB — ALBUMIN: Albumin: 2.2 g/dL — ABNORMAL LOW (ref 3.5–5.0)

## 2019-05-22 LAB — LIPASE, BLOOD: Lipase: 774 U/L — ABNORMAL HIGH (ref 11–51)

## 2019-05-22 MED ORDER — DEXTROSE-NACL 5-0.9 % IV SOLN: INTRAVENOUS | Status: DC

## 2019-05-22 MED ORDER — SODIUM BICARBONATE-DEXTROSE 150-5 MEQ/L-% IV SOLN
150.0000 meq | INTRAVENOUS | Status: DC
  Filled 2019-05-22 (×2): qty 1000

## 2019-05-22 MED ORDER — DEXTROSE 50 % IV SOLN
INTRAVENOUS | Status: AC
  Filled 2019-05-22: qty 50

## 2019-05-22 MED ORDER — ALBUMIN HUMAN 25 % IV SOLN
12.5000 g | Freq: Once | INTRAVENOUS | Status: AC
  Administered 2019-05-22: 12.5 g via INTRAVENOUS
  Filled 2019-05-22: qty 50

## 2019-05-22 MED ORDER — NOREPINEPHRINE 4 MG/250ML-% IV SOLN
0.0000 ug/min | INTRAVENOUS | Status: DC
  Administered 2019-05-22: 10:00:00 2 ug/min via INTRAVENOUS
  Filled 2019-05-22 (×3): qty 250

## 2019-05-22 MED ORDER — DEXTROSE 50 % IV SOLN
25.0000 mL | Freq: Once | INTRAVENOUS | Status: AC
  Administered 2019-05-22: 25 mL via INTRAVENOUS

## 2019-05-22 MED ORDER — SODIUM CHLORIDE 0.9 % IV BOLUS
1000.0000 mL | Freq: Once | INTRAVENOUS | Status: AC
  Administered 2019-05-22: 1000 mL via INTRAVENOUS

## 2019-05-22 MED ORDER — SODIUM CHLORIDE 0.9 % IV BOLUS
500.0000 mL | Freq: Once | INTRAVENOUS | Status: AC
  Administered 2019-05-22: 19:00:00 500 mL via INTRAVENOUS

## 2019-05-22 MED ORDER — PIPERACILLIN-TAZOBACTAM 3.375 G IVPB
3.3750 g | Freq: Two times a day (BID) | INTRAVENOUS | Status: DC
  Administered 2019-05-22 – 2019-05-23 (×2): 3.375 g via INTRAVENOUS
  Filled 2019-05-22 (×2): qty 50

## 2019-05-22 MED ORDER — SODIUM CHLORIDE 0.9 % IV SOLN
500.0000 mg | Freq: Two times a day (BID) | INTRAVENOUS | Status: DC
  Administered 2019-05-22: 500 mg via INTRAVENOUS
  Filled 2019-05-22: qty 500
  Filled 2019-05-22: qty 0.5

## 2019-05-22 MED ORDER — NOREPINEPHRINE 16 MG/250ML-% IV SOLN
0.0000 ug/min | INTRAVENOUS | Status: DC
  Administered 2019-05-23: 11:00:00 24 ug/min via INTRAVENOUS
  Administered 2019-05-23: 40 ug/min via INTRAVENOUS
  Filled 2019-05-22 (×3): qty 250

## 2019-05-22 MED ORDER — PHENYLEPHRINE CONCENTRATED 100MG/250ML (0.4 MG/ML) INFUSION SIMPLE
0.0000 ug/min | INTRAVENOUS | Status: DC
  Administered 2019-05-22: 50 ug/min via INTRAVENOUS
  Filled 2019-05-22: qty 250

## 2019-05-22 MED ORDER — VASOPRESSIN 20 UNIT/ML IV SOLN
0.0300 [IU]/min | INTRAVENOUS | Status: DC
  Administered 2019-05-22 – 2019-05-23 (×2): 0.03 [IU]/min via INTRAVENOUS
  Filled 2019-05-22 (×2): qty 2

## 2019-05-22 MED ORDER — SODIUM BICARBONATE-DEXTROSE 150-5 MEQ/L-% IV SOLN
150.0000 meq | INTRAVENOUS | Status: DC
  Administered 2019-05-22 – 2019-05-23 (×3): 150 meq via INTRAVENOUS
  Filled 2019-05-22 (×3): qty 1000

## 2019-05-22 MED ORDER — FENTANYL CITRATE (PF) 100 MCG/2ML IJ SOLN
25.0000 ug | Freq: Once | INTRAMUSCULAR | Status: AC
  Administered 2019-05-22: 25 ug via INTRAVENOUS

## 2019-05-22 MED ORDER — SODIUM CHLORIDE 0.9 % IV BOLUS
1000.0000 mL | Freq: Once | INTRAVENOUS | Status: AC
  Administered 2019-05-22: 10:00:00 1000 mL via INTRAVENOUS

## 2019-05-22 NOTE — Progress Notes (Signed)
ANTICOAGULATION CONSULT NOTE  Pharmacy Consult for warfarin monitoring Indication: VTE prophylaxis; patient w/ h/o DVT/PE  Allergies  Allergen Reactions  . Percocet [Oxycodone-Acetaminophen] Itching  . Sulfa Antibiotics Rash    Patient Measurements: Height: 5\' 2"  (157.5 cm) Weight: 170 lb (77.1 kg) IBW/kg (Calculated) : 50.1  Vital Signs: Temp: 97.4 F (36.3 C) (01/03 2357) Temp Source: Oral (01/03 2357) BP: 87/47 (01/04 0658) Pulse Rate: 94 (01/04 0658)  Labs: Recent Labs    05/20/19 1920 06-07-2019 0130 June 07, 2019 0430 06/07/19 1203 05/22/19 0428  HGB 14.5  --  15.9*  --  15.2*  HCT 41.6  --  48.3*  --  46.5*  PLT 191  --  194  --  169  LABPROT  --  25.8*  --   --  35.5*  INR  --  2.4*  --   --  3.6*  CREATININE 1.13*  --  1.09*  --  3.51*  TROPONINIHS 11  --  30* 110*  --     Estimated Creatinine Clearance: 12.3 mL/min (A) (by C-G formula based on SCr of 3.51 mg/dL (H)).   Medical History: Past Medical History:  Diagnosis Date  . DVT (deep venous thrombosis) ([facility])   . GERD (gastroesophageal reflux disease)   . Hypertension   . PE (pulmonary embolism)     Medications:  Scheduled:  . Chlorhexidine Gluconate Cloth  6 each Topical Daily  . conjugated estrogens  1 Applicatorful Vaginal Once per day on Mon Thu  . pantoprazole  40 mg Oral Daily  . Warfarin - Pharmacist Dosing Inpatient   Does not apply q1800    Assessment: Patient arrives w/ c/o abdominal pain s/t acute pancreatitis on CT. Patient also w/ h/o DVT/PE anticoagulated w/ warfarin 4 mg daily PTA.  01/03 INR 2.4 - Lovenox 40 mg subq x 1.  Warfarin not   given (NPO) 01/04 INR 3.6  Goal of Therapy:  INR 2-3 Monitor platelets by anticoagulation protocol: Yes   Plan:  01/04 @ 0500 INR 3.6 supratherapeutic. Will hold off on further doses of warfarin and will continue to monitor daily INRs. Will redose once INR falls < 3.0. Patient actually received a dose of lovenox 40 mg subq x 1 before warfarin  came across. Patient has not yet had any doses of warfarin in-house s/t being NPO from pancreatitis. Hgb 15.2  Plt 169. Will f/u INR in am.   03/04 PharmD Clinical Pharmacist 05/22/2019

## 2019-05-22 NOTE — Progress Notes (Signed)
Patient with soft pressures and low /normal blood glucose,. MIVF changed to D5NS at increased rate of 100. Lactic acid added to am labs.  Potassium slightly elevated at 5.4. repeat chem panel scheduled for 1200

## 2019-05-22 NOTE — Progress Notes (Signed)
Rapid called this morning, pt lethargic in comparison to this morning. Pt developed SOB and hands became very cold. Pt O2 was destating into the 50's. MD notified, pt transferred to ICU.

## 2019-05-22 NOTE — Progress Notes (Signed)
[facility]  ROUNDING NOTE   Subjective:  Patient well known to [place] from the office. Last seen on 04/18/19.  At that time had an EGFR of of 52.  Now has AKI with metabolic acidosis. Currently on bicarg gtt.  Received CT with contrast as well.   NG tube with significant bilious drainage.     Objective:  Vital signs in last 24 hours:  Temp:  [97.4 F (36.3 C)-97.6 F (36.4 C)] 97.6 F (36.4 C) (01/04 0930) Pulse Rate:  [44-103] 87 (01/04 0930) Resp:  [16-44] 40 (01/04 0930) BP: (77-154)/(34-102) 78/42 (01/04 0930) SpO2:  [62 %-97 %] 89 % (01/04 0601) Weight:  [84.2 kg] 84.2 kg (01/04 0930)  Weight change:  Filed Weights   05/20/19 1909 05/22/19 0930  Weight: 77.1 kg 84.2 kg    Intake/Output: I/O last 3 completed shifts: In: 1138.1 [I.V.:538.1; NG/GT:200; IV Piggyback:400] Out: 1 [Stool:1]   Intake/Output this shift:  No intake/output data recorded.  Physical Exam: General: No acute distress  Head: Normocephalic, atraumatic. NG tube in place  Eyes: Anicteric  Neck: Supple, trachea midline  Lungs:  Clear to auscultation, normal effort  Heart: S1S2 no rubs  Abdomen:  Distended, scant BS  Extremities: No peripheral edema.  Neurologic: Awake, alert, following commands  Skin: No lesions       Basic Metabolic Panel: Recent Labs  Lab 05/20/19 1920 05/25/2019 0430 05/22/19 0428  NA 140 142 142  K 3.3* 3.2* 5.4*  CL 105 111 113*  CO2 20* 19* 12*  GLUCOSE 180* 153* 82  BUN 30* 28* 51*  CREATININE 1.13* 1.09* 3.51*  CALCIUM 9.8 7.6* 8.6*    Liver Function Tests: Recent Labs  Lab 05/20/19 1920  AST 47*  ALT 27  ALKPHOS 50  BILITOT 0.8  PROT 8.2*  ALBUMIN 3.9   Recent Labs  Lab 05/20/19 1920 06/02/2019 0430 05/22/19 0428  LIPASE 1,352* 570* 774*   No results for input(s): AMMONIA in the last 168 hours.  CBC: Recent Labs  Lab 05/20/19 1920 06/02/2019 0430 05/22/19 0428  WBC 10.5 11.1* 10.6*  HGB 14.5 15.9* 15.2*  HCT 41.6 48.3*  46.5*  MCV 94.5 99.4 100.0  PLT 191 194 169    Cardiac Enzymes: No results for input(s): CKTOTAL, CKMB, CKMBINDEX, TROPONINI in the last 168 hours.  BNP: Invalid input(s): POCBNP  CBG: Recent Labs  Lab 05/25/2019 1414 05/22/19 0557 05/22/19 0655 05/22/19 0752 05/22/19 0922  GLUCAP 143* 64* 109* 96 82    Microbiology: Results for orders placed or performed during the hospital encounter of 06/18/2019  Urine culture     Status: Abnormal (Preliminary result)   Collection Time: 06/05/2019  1:00 AM   Specimen: Urine, Random  Result Value Ref Range Status   Specimen Description   Final    URINE, RANDOM Performed at [facility], [address], [place], [place] [address]    Special Requests   Final    NONE Performed at [facility], [address] [place], [place] [address]    Culture (A)  Final    >=100,000 COLONIES/mL ESCHERICHIA COLI SUSCEPTIBILITIES TO FOLLOW Performed at [facility], [place] [address] [place], [place] [address]    Report Status PENDING  Incomplete  SARS CORONAVIRUS 2 (TAT 6-24 HRS) Nasopharyngeal Nasopharyngeal Swab     Status: None   Collection Time: 06/11/2019  2:24 AM   Specimen: Nasopharyngeal Swab  Result Value Ref Range Status   SARS Coronavirus 2 NEGATIVE NEGATIVE  Final    Comment: (NOTE) SARS-CoV-2 target nucleic acids are NOT DETECTED. The SARS-CoV-2 RNA is generally detectable in upper and lower respiratory specimens during the acute phase of infection. Negative results do not preclude SARS-CoV-2 infection, do not rule out co-infections with other pathogens, and should not be used as the sole basis for treatment or other patient management decisions. Negative results must be combined with clinical observations, patient history, and epidemiological information. The expected result is Negative. Fact Sheet for Patients: SugarRoll.be Fact Sheet for Healthcare  Providers: https://www.woods-mathews.com/ This test is not yet approved or cleared by the [place] FDA and  has been authorized for detection and/or diagnosis of SARS-CoV-2 by FDA under an Emergency Use Authorization (EUA). This EUA will remain  in effect (meaning this test can be used) for the duration of the COVID-19 declaration under Section 56 4(b)(1) of the Act, 21 U.S.C. section 360bbb-3(b)(1), unless the authorization is terminated or revoked sooner. Performed at [facility], [place] [address], [place], [place] [address]   MRSA PCR Screening     Status: None   Collection Time: 05/22/19  9:33 AM   Specimen: Nasopharyngeal  Result Value Ref Range Status   MRSA by PCR NEGATIVE NEGATIVE Final    Comment:        The GeneXpert MRSA Assay (FDA approved for NASAL specimens only), is one component of a comprehensive MRSA colonization surveillance program. It is not intended to diagnose MRSA infection nor to guide or monitor treatment for MRSA infections. Performed at [facility], [address] [place], [place] [address]     Coagulation Studies: Recent Labs    05/31/2019 0130 05/22/19 0428  LABPROT 25.8* 35.5*  INR 2.4* 3.6*    Urinalysis: Recent Labs    06/01/2019 0100  COLORURINE YELLOW*  LABSPEC 1.025  PHURINE 5.5  GLUCOSEU NEGATIVE  HGBUR SMALL*  BILIRUBINUR NEGATIVE  KETONESUR NEGATIVE  PROTEINUR NEGATIVE  NITRITE NEGATIVE  LEUKOCYTESUR MODERATE*      Imaging: CT ABDOMEN PELVIS W CONTRAST  Result Date: 05/27/2019 CLINICAL DATA:  Epigastric and upper abdominal pain for 2 hours. EXAM: CT ABDOMEN AND PELVIS WITH CONTRAST TECHNIQUE: Multidetector CT imaging of the abdomen and pelvis was performed using the standard protocol following bolus administration of intravenous contrast. CONTRAST:  155m OMNIPAQUE IOHEXOL 300 MG/ML  SOLN COMPARISON:  CT 06/21/2013 FINDINGS: Lower chest: Bibasilar atelectasis, right greater than left.  Suspected bibasilar bronchiectasis. No pleural fluid. Hepatobiliary: No focal hepatic abnormality. Postcholecystectomy. No biliary dilatation. No visualized choledocholithiasis. Pancreas: Moderate to severe peripancreatic fat stranding and free fluid about the uncinate process, head, and proximal body of the pancreas. Moderate non organized free fluid in the upper abdomen. No evidence of pancreatic necrosis. No organized fluid collection. No ductal dilatation. No visualized pancreatic mass. Spleen: Normal in size without focal abnormality. Adrenals/Urinary Tract: Normal adrenal glands. Multiple bilateral renal cysts. No hydronephrosis. No evidence of solid renal lesion. Homogeneous renal enhancement with symmetric excretion on delayed phase imaging. Urinary bladder is physiologically distended. No bladder wall thickening. Stomach/Bowel: Distended distal esophagus with intraluminal fluid. Fluid-filled distended stomach. No gastric wall thickening. Prominent fluid-filled duodenum without duodenal wall thickening. Proximal jejunum also fluid-filled. There is no small bowel wall thickening or inflammation. No obstruction. Normal appendix. Small volume of stool in the colon. No colonic wall thickening. Vascular/Lymphatic: Prominent portal caval and peripancreatic nodes, all subcentimeter. Portal vein is patent. Mesenteric vessels are patent. Aortic atherosclerosis without aneurysm. Reproductive: Pessary in place. Hysterectomy. No adnexal mass. Other:  Moderate volume of free fluid in the upper abdomen about the pancreas, duodenum, and tracking in the right upper quadrant. Small amount of free fluid in the right pericolic gutter and pelvis. No free air. No organized collection. Musculoskeletal: Multilevel degenerative change in the spine. There are no acute or suspicious osseous abnormalities. IMPRESSION: 1. Acute pancreatitis with moderate to severe peripancreatic fat stranding and free fluid. No evidence of pancreatic  necrosis or organized fluid collection. 2. Fluid-filled distended distal esophagus, stomach and proximal small bowel, likely secondary to pancreatitis. Aortic Atherosclerosis (ICD10-I70.0). Electronically Signed   By: Keith Rake M.D.   On: 05/22/2019 01:59   DG Chest Port 1 View  Result Date: 05/22/2019 CLINICAL DATA:  Central line placement. EXAM: PORTABLE CHEST 1 VIEW COMPARISON:  One-view chest x-ray 05/22/2019 at 8:52 a.m. FINDINGS: NG tube courses off the inferior border the film. And a left IJ line is in place. Tip is at the cavoatrial junction. Lung volumes are low. Bilateral pleural effusions are present. Bibasilar airspace opacities are again noted. IMPRESSION: 1. Left IJ line terminates at the cavoatrial junction. No radiographic evidence for complication. 2. Stable bilateral pleural effusions and bibasilar airspace disease. While this likely reflects atelectasis, infection is not excluded. Electronically Signed   By: San Morelle M.D.   On: 05/22/2019 10:48   DG Chest Port 1 View  Result Date: 05/22/2019 CLINICAL DATA:  Dyspnea EXAM: PORTABLE CHEST 1 VIEW COMPARISON:  06/16/2019 chest radiograph. FINDINGS: Enteric tube enters stomach with the tip not seen on this image. Stable cardiomediastinal silhouette with normal heart size. No pneumothorax. No pleural effusion. Low lung volumes. No overt pulmonary edema. Similar streaky opacities at both lung bases. IMPRESSION: 1.  Enteric tube enters stomach with the tip not seen on this image. 2. Low lung volumes with similar streaky opacities at both lung bases, favor atelectasis. Electronically Signed   By: Ilona Sorrel M.D.   On: 05/22/2019 09:14   DG Chest Port 1 View  Result Date: 06/08/2019 CLINICAL DATA:  Shortness of breath EXAM: PORTABLE CHEST 1 VIEW COMPARISON:  June 13, 2015 FINDINGS: The heart size is stable from prior study. Aortic calcifications are noted. The enteric tube terminates over the gastric body. Bilateral pleural  effusions and bibasilar airspace disease is noted. There is no pneumothorax. IMPRESSION: 1. Enteric tube projects over the gastric body. 2. Developing small bilateral pleural effusions with bibasilar atelectasis. Electronically Signed   By: Constance Holster M.D.   On: 05/29/2019 15:08     Medications:   . famotidine (PEPCID) IV    . meropenem (MERREM) IV    . norepinephrine (LEVOPHED) Adult infusion    . sodium bicarbonate 150 mEq in dextrose 5% 1000 mL     . Chlorhexidine Gluconate Cloth  6 each Topical Daily  . conjugated estrogens  1 Applicatorful Vaginal Once per day on Mon Thu  . pantoprazole  40 mg Oral Daily  . Warfarin - Pharmacist Dosing Inpatient   Does not apply q1800   acetaminophen **OR** acetaminophen, fentaNYL (SUBLIMAZE) injection, ketorolac, magnesium hydroxide, metoprolol tartrate, ondansetron **OR** ondansetron (ZOFRAN) IV, traZODone  Assessment/ Plan:  79 y.o. female with past medical history of CKD stage 3A, hypertension, lower extremity edema, hyperlipidemia, osteoarthritis, hx of RLE DVT, pulmonary embolism, chronic microscopic hematuria, hx of cystocele admitted with pancreatitis possibly due to HCTZ with mild ileus.    1.  Acute kidney injury/chronic kidney disease stage IIIa baseline EGFR 52.  Patient presents now with pancreatitis and ileus.  Suspect  acute kidney injury secondary to ATN but there may be some component of contrast-induced nephropathy as well.  Continue IV fluid hydration with sodium bicarbonate drip.  No urgent indication for dialysis at the moment however this may need to be considered if renal function continues to deteriorate.  2.  Metabolic acidosis.  Serum bicarbonate low at 12.  Agree with sodium bicarbonate drip.  3.  Hyperkalemia.  Serum potassium currently 5.4.  Unable to give Veltassa or sodium zirconium at the moment given n.p.o. status.  Hyperkalemia largely driven by metabolic acidosis.  Should improve with improvements in  acidosis.    LOS: 1 Nikitha Mode 1/4/202112:21 PM

## 2019-05-22 NOTE — Progress Notes (Signed)
ANTICOAGULATION CONSULT NOTE - Initial Consult  Pharmacy Consult for warfarin monitoring Indication: VTE prophylaxis; patient w/ h/o DVT/PE  Allergies  Allergen Reactions  . Percocet [Oxycodone-Acetaminophen] Itching  . Sulfa Antibiotics Rash    Patient Measurements: Height: 5\' 2"  (157.5 cm) Weight: 170 lb (77.1 kg) IBW/kg (Calculated) : 50.1  Vital Signs: Temp: 97.4 F (36.3 C) (01/03 2357) Temp Source: Oral (01/03 2357) BP: 87/34 (01/04 0527) Pulse Rate: 44 (01/04 0527)  Labs: Recent Labs    05/20/19 1920 05/25/2019 0130 25-May-2019 0430 2019-05-25 1203 05/22/19 0428  HGB 14.5  --  15.9*  --  15.2*  HCT 41.6  --  48.3*  --  46.5*  PLT 191  --  194  --  169  LABPROT  --  25.8*  --   --  35.5*  INR  --  2.4*  --   --  3.6*  CREATININE 1.13*  --  1.09*  --   --   TROPONINIHS 11  --  30* 110*  --     Estimated Creatinine Clearance: 39.6 mL/min (A) (by C-G formula based on SCr of 1.09 mg/dL (H)).   Medical History: Past Medical History:  Diagnosis Date  . DVT (deep venous thrombosis) (HCC)   . GERD (gastroesophageal reflux disease)   . Hypertension   . PE (pulmonary embolism)     Medications:  Scheduled:  . Chlorhexidine Gluconate Cloth  6 each Topical Daily  . conjugated estrogens  1 Applicatorful Vaginal Once per day on Mon Thu  . lisinopril  10 mg Oral Daily  . pantoprazole  40 mg Oral Daily  . Warfarin - Pharmacist Dosing Inpatient   Does not apply q1800    Assessment: Patient arrives w/ c/o abdominal pain s/t acute pancreatitis on CT. Patient also w/ h/o DVT/PE anticoagulated w/ warfarin 4 mg daily PTA.  01/03 INR 2.4 - Lovenox 40 mg subq x 1 01/04 INR 3.6  Goal of Therapy:  INR 2-3 Monitor platelets by anticoagulation protocol: Yes   Plan:  01/04 @ 0500 INR 3.6 supratherapeutic. Will hold off on further doses of warfarin and will continue to monitor daily INRs. Will redose once INR falls < 3.0. Patient actually received a dose of lovenox 40 mg subq  x 1 before warfarin came across. Patient has not yet had any doses of warfarin in-house s/t being NPO from pancreatitis. Will continue to monitor.  03/04, PharmD, BCPS Clinical Pharmacist 05/22/2019,5:32 AM

## 2019-05-22 NOTE — Significant Event (Signed)
Rapid Response Event Note  Overview: Time Called: 0840 Arrival Time: 0845 Event Type: Hypotension, Respiratory  Initial Focused Assessment: Patient found to be hypotensive in the 70's systolic, lethargic-  and decreased oxygen saturation in the 80's on 4 liters [place]. Interventions: Patient placed on non-rebreather mask, 500 ml bolus given, chest xray ordered.  Patient transferred to ICU for stepdown status. Plan of Care (if not transferred):  Event Summary: Name of Physician Notified: Wilfred Lacy, MD at 1425    at    Outcome: Stayed in room and stabalized  Event End Time: 1500  Debara Kamphuis J

## 2019-05-22 NOTE — Progress Notes (Addendum)
PROGRESS NOTE    [name]  QBH:[id number] DOB: 1940/09/05 DOA: 06/05/19 PCP: Casilda Carls, MD      Assessment & Plan:   Active Problems:   Acute pancreatitis   Acute cystitis with hematuria   Hypokalemia   Acute hypoxic respiratory failure: etiology unclear. Continue on supplemental oxygen and wean as tolerated. CXR shows stable bilateral pleural effusions and bibasilar airspace disease. While this likely reflects atelectasis, infection is not excluded.  Hypotension: continue on IV levophed, keep MAP >65. Pt transferred to the ICU   AKI: etiology unclear, possibly secondary to ATN from hypotension. Cr is trending up today. Will hold home dose of lisinopril. Avoid nephrotoxic meds. Nephro consulted  Metabolic acidosis: continue on bicarb drip. Nephro consulted   Hyperkalemia: repeat K ordered.   Acute pancreatitis: possibly related to HCTZ with likely associated mild ileus. TG 61. NPO. Continue on IVFs.  Fentanyl prn for pain. Lipase is trending up today. D/c HCTZ in [place].    Lactic acidosis: continue on IVFs. Repeat lactic acid ordered  Likely ileus: NPO. S/p NG tube w/ low intermittent suction after rapid response called yesterday for tachypnea.   UTI: urine cx is growing e. coli, sens pending. Continue on IV ceftriaxone  Leukocytosis: likely secondary to infection. Continue on abxs  Elevated troponins: likely secondary to demand ischemia. No acute ischemic EKG changes. No CP.   Hyperglycemia: no hx of DM.  HbA1c pending. 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 Keep INR between 2-3.   Hypertension: will hold lisinopril secondary to AKI & hypotension and stop HCTZ in [place].  GERD: continue on IV pepcid  Transaminitis: AST is elevated but ALT is WNL. Etiology unclear. Will continue to monitor   DVT prophylaxis: coumadin Code Status:  full Family Communication: discussed w/ pt's daughter, Mrs. Tonye Becket, via  telephone about above pt's care, and answered all of her questions. I also tried to call daughter, Mrs. Blossom Hoops but no answer.  Disposition Plan:   Consultants:   ICU, nephro   Procedures:   Antimicrobials: ceftriaxone   Subjective: Pt c/o shortness of breath & malaise.  Objective: Vitals:   06-05-2019 2357 05/22/19 0527 05/22/19 0601 05/22/19 0658  BP: (!) 91/47 (!) 87/34 (!) 80/56 (!) 87/47  Pulse: (!) 103 (!) 44 98 94  Resp: 16 18    Temp: (!) 97.4 F (36.3 C)     TempSrc: Oral     SpO2: 97% (!) 77% (!) 89%   Weight:      Height:        Intake/Output Summary (Last 24 hours) at 05/22/2019 0724 Last data filed at 05/22/2019 0520 Gross per 24 hour  Intake 838.14 ml  Output 1 ml  Net 837.14 ml   Filed Weights   05/20/19 1909  Weight: 77.1 kg    Examination:  General exam: Appears calm but uncomfortable  Respiratory system: decreased breath sounds b/l otherwise clear. No rales.  Cardiovascular system: S1 & S2 +. No , rubs, gallops or clicks. a. Gastrointestinal system: Abdomen is mildly distended, soft and mild tenderness to palpation. Hypoactive bowel sounds heard. Central nervous system: Alert and oriented. Moves all 4 extremities. Psychiatry: Flat mood and affect     Data Reviewed: I have personally reviewed following labs and imaging studies  CBC: Recent Labs  Lab 05/20/19 1920 2019/06/05 0430 05/22/19 0428  WBC 10.5 11.1* 10.6*  HGB 14.5 15.9* 15.2*  HCT 41.6 48.3* 46.5*  MCV  94.5 99.4 100.0  PLT 191 194 169   Basic Metabolic Panel: Recent Labs  Lab 05/20/19 1920 05/30/2019 0430 05/22/19 0428  NA 140 142 142  K 3.3* 3.2* 5.4*  CL 105 111 113*  CO2 20* 19* 12*  GLUCOSE 180* 153* 82  BUN 30* 28* 51*  CREATININE 1.13* 1.09* 3.51*  CALCIUM 9.8 7.6* 8.6*   GFR: Estimated Creatinine Clearance: 12.3 mL/min (A) (by C-G formula based on SCr of 3.51 mg/dL (H)). Liver Function Tests: Recent Labs  Lab 05/20/19 1920  AST 47*  ALT 27   ALKPHOS 50  BILITOT 0.8  PROT 8.2*  ALBUMIN 3.9   Recent Labs  Lab 05/20/19 1920 06/10/2019 0430 05/22/19 0428  LIPASE 1,352* 570* 774*   No results for input(s): AMMONIA in the last 168 hours. Coagulation Profile: Recent Labs  Lab 05/22/2019 0130 05/22/19 0428  INR 2.4* 3.6*   Cardiac Enzymes: No results for input(s): CKTOTAL, CKMB, CKMBINDEX, TROPONINI in the last 168 hours. BNP (last 3 results) No results for input(s): PROBNP in the last 8760 hours. HbA1C: No results for input(s): HGBA1C in the last 72 hours. CBG: Recent Labs  Lab 05/22/19 0557 05/22/19 0655  GLUCAP 64* 109*   Lipid Profile: Recent Labs    05/20/2019 0430  CHOL 110  HDL 52  LDLCALC 46  TRIG 61  CHOLHDL 2.1   Thyroid Function Tests: No results for input(s): TSH, T4TOTAL, FREET4, T3FREE, THYROIDAB in the last 72 hours. Anemia Panel: No results for input(s): VITAMINB12, FOLATE, FERRITIN, TIBC, IRON, RETICCTPCT in the last 72 hours. Sepsis Labs: No results for input(s): PROCALCITON, LATICACIDVEN in the last 168 hours.  Recent Results (from the past 240 hour(s))  SARS CORONAVIRUS 2 (TAT 6-24 HRS) Nasopharyngeal Nasopharyngeal Swab     Status: None   Collection Time: 06/10/2019  2:24 AM   Specimen: Nasopharyngeal Swab  Result Value Ref Range Status   SARS Coronavirus 2 NEGATIVE NEGATIVE Final    Comment: (NOTE) SARS-CoV-2 target nucleic acids are NOT DETECTED. The SARS-CoV-2 RNA is generally detectable in upper and lower respiratory specimens during the acute phase of infection. Negative results do not preclude SARS-CoV-2 infection, do not rule out co-infections with other pathogens, and should not be used as the sole basis for treatment or other patient management decisions. Negative results must be combined with clinical observations, patient history, and epidemiological information. The expected result is Negative. Fact Sheet for Patients: HairSlick.no Fact  Sheet for Healthcare Providers: quierodirigir.com This test is not yet approved or cleared by the [place] FDA and  has been authorized for detection and/or diagnosis of SARS-CoV-2 by FDA under an Emergency Use Authorization (EUA). This EUA will remain  in effect (meaning this test can be used) for the duration of the COVID-19 declaration under Section 56 4(b)(1) of the Act, 21 U.S.C. section 360bbb-3(b)(1), unless the authorization is terminated or revoked sooner. Performed at [facility], [address] [place], [place] [address]          Radiology Studies: CT ABDOMEN PELVIS W CONTRAST  Result Date: 05/22/2019 CLINICAL DATA:  Epigastric and upper abdominal pain for 2 hours. EXAM: CT ABDOMEN AND PELVIS WITH CONTRAST TECHNIQUE: Multidetector CT imaging of the abdomen and pelvis was performed using the standard protocol following bolus administration of intravenous contrast. CONTRAST:  OMNIPAQUE IOHEXOL 300 MG/ML  SOLN COMPARISON:  CT 06/21/2013 FINDINGS: Lower chest: Bibasilar atelectasis, right greater than left. Suspected bibasilar bronchiectasis. No pleural fluid. Hepatobiliary: No focal hepatic  abnormality. Postcholecystectomy. No biliary dilatation. No visualized choledocholithiasis. Pancreas: Moderate to severe peripancreatic fat stranding and free fluid about the uncinate process, head, and proximal body of the pancreas. Moderate non organized free fluid in the upper abdomen. No evidence of pancreatic necrosis. No organized fluid collection. No ductal dilatation. No visualized pancreatic mass. Spleen: Normal in size without focal abnormality. Adrenals/Urinary Tract: Normal adrenal glands. Multiple bilateral renal cysts. No hydronephrosis. No evidence of solid renal lesion. Homogeneous renal enhancement with symmetric excretion on delayed phase imaging. Urinary bladder is physiologically distended. No bladder wall thickening. Stomach/Bowel:  Distended distal esophagus with intraluminal fluid. Fluid-filled distended stomach. No gastric wall thickening. Prominent fluid-filled duodenum without duodenal wall thickening. Proximal jejunum also fluid-filled. There is no small bowel wall thickening or inflammation. No obstruction. Normal appendix. Small volume of stool in the colon. No colonic wall thickening. Vascular/Lymphatic: Prominent portal caval and peripancreatic nodes, all subcentimeter. Portal vein is patent. Mesenteric vessels are patent. Aortic atherosclerosis without aneurysm. Reproductive: Pessary in place. Hysterectomy. No adnexal mass. Other: Moderate volume of free fluid in the upper abdomen about the pancreas, duodenum, and tracking in the right upper quadrant. Small amount of free fluid in the right pericolic gutter and pelvis. No free air. No organized collection. Musculoskeletal: Multilevel degenerative change in the spine. There are no acute or suspicious osseous abnormalities. IMPRESSION: 1. Acute pancreatitis with moderate to severe peripancreatic fat stranding and free fluid. No evidence of pancreatic necrosis or organized fluid collection. 2. Fluid-filled distended distal esophagus, stomach and proximal small bowel, likely secondary to pancreatitis. Aortic Atherosclerosis (ICD10-I70.0). Electronically Signed   By: Narda Rutherford M.D.   On: 06/02/2019 01:59   DG Chest Port 1 View  Result Date: 05/19/2019 CLINICAL DATA:  Shortness of breath EXAM: PORTABLE CHEST 1 VIEW COMPARISON:  June 13, 2015 FINDINGS: The heart size is stable from prior study. Aortic calcifications are noted. The enteric tube terminates over the gastric body. Bilateral pleural effusions and bibasilar airspace disease is noted. There is no pneumothorax. IMPRESSION: 1. Enteric tube projects over the gastric body. 2. Developing small bilateral pleural effusions with bibasilar atelectasis. Electronically Signed   By: Katherine Mantle M.D.   On: 05/22/2019 15:08         Scheduled Meds: . Chlorhexidine Gluconate Cloth  6 each Topical Daily  . conjugated estrogens  1 Applicatorful Vaginal Once per day on Mon Thu  . lisinopril  10 mg Oral Daily  . pantoprazole  40 mg Oral Daily  . Warfarin - Pharmacist Dosing Inpatient   Does not apply q1800   Continuous Infusions: . cefTRIAXone (ROCEPHIN)  IV 1 g (05/22/19 0455)  . dextrose 5 % and 0.9% NaCl 100 mL/hr at 05/22/19 0631  . famotidine (PEPCID) IV       LOS: 1 day    Time spent: 35 mins    Charise Killian, MD [facility] Pager [phone]  If 7PM-7AM, please contact night-coverage [email or web address] Password [facility] 05/22/2019, 7:24 AM

## 2019-05-22 NOTE — Procedures (Signed)
Central Venous Catheter Placement:TRIPLE LUMEN Indication: Patient requiring pressors. Patient has limited or no vascular access.   Consent:emergent, obtained consent from daughter via telephone  Hand washing performed prior to starting the procedure.   Procedure:   An active timeout was performed and correct patient, name, ID and laterality confirmed.  Patient was positioned correctly for central venous access.  Patient was prepped using strict sterile technique including chlorohexadine preps, sterile drape, sterile gown and sterile gloves.    The area was prepped, draped and anesthetized in the usual sterile manner. Patient comfort was obtained.  Received 25 mg fentanyl IV x1 for procedure.  A triple lumen catheter was placed in LEFT internal jugular vein. There was good blood return, catheter caps were placed on lumens, catheter flushed easily, the line was secured and a sterile dressing and BIO-PATCH applied.   Ultrasound was used to visualize vasculature and guidance of needle.  By ultrasound, the patient appeared to be significantly volume depleted as the right IJ was extremely collapsible.  Number of Attempts: 1 Complications:none Estimated Blood Loss: < 2 ml Chest Radiograph indicated and ordered: Line in good position, no pneumothorax.  Operator: Gailen Shelter, MD   C. Danice Goltz, MD [facility]

## 2019-05-22 NOTE — Consult Note (Addendum)
[facility]  Pulmonary Medicine Consultation      Name: [name] MRN: [id number] DOB: 12-13-1940    ADMISSION DATE:  05/25/2019 CONSULTATION DATE:  05/21/2018  REFERRING MD :  Dr. Fabienne Bruns   CHIEF COMPLAINT:     Acute pancreatitis   HISTORY OF PRESENT ILLNESS    [name] is an 79 yo female with a history of PE/DVT, GERD, hypertension, cholecystectomy and hysterectomy who presented to the ED on 05/22/2019 after developing sudden onset of sharp epigastric abdominal pain starting at 1:00 PM yesterday and several episodes of emesis without hematemesis or bilious vomit.  She denied fevers, chills, chest pain, changes in urination, and changes in bowel movements.    ED workup demonstrated an elevated lipase of 1352.   CT abdomen/pelvis showed peripancreatic fat stranding and surrounding free fluid, and distension in proximal duodenum, stomach and distal esophagus,  consistent with acute pancreatitis with possible ileus.  Patient has no prior history of pancreatitis, and denies history of alcohol use, and is s/p cholecystectomy, triglycerides within normal range at 61.  Pancreatitis possibly attributed to long-term use of HCTZ.    Further ED workup, demonstrated a urinalysis with large leukocytes, moderate hemoglobin, and many bacteria.  Urine culture consistent with E. Coli cystitis.  In addition, PT and INR were elevated at 25.8 and 2.4 respectively.  Patient on warfarin 4 mg QD.  Troponin elevated at 11 and then 30.  EKG showed normal sinus rhythm with no evidence of acute changes.    Patient admitted by Dr. Valente David on 05/20/2018.  She was made NPO, and IV fluid resuscitation with  normal saline was initiated. HCTZ discontinued.  IV rocephin initiated for UTI.    Today, Lactic acid elevated to 7.7.  Patient exhibiting metabolic acidosis with pH of 7.17 pCO2 of 30, and bicarbonate of 10.9 in venous blood gas (unable to collect ABG due to Garvin test).  Creatinine  elevated at 3.89, GFR 12.  She was in respiratory distress and hypotensive.  We were asked to evaluate and assist with regards to critical care management.    SIGNIFICANT EVENTS   1/3- Patient presented to ED with abdominal pain and emesis.  Elevated lipase of 1352, and CT consistent with acute pancreatitis.  Urinalysis & culture consistent with e. Coli UTI 1/4- ICU consulted for hypotension, lethargy and decreased oxygen saturation.  Central line placed(LEFT IJ).      PAST MEDICAL HISTORY   Past Medical History:  Diagnosis Date  . DVT (deep venous thrombosis) (HCC)   . GERD (gastroesophageal reflux disease)   . Hypertension   . PE (pulmonary embolism)    Past Surgical History:  Procedure Laterality Date  . ABDOMINAL HYSTERECTOMY    . CHOLECYSTECTOMY    . KNEE ARTHROSCOPY Right    x 2  . KNEE SURGERY Left    Prior to Admission medications   Medication Sig Start Date End Date Taking? Authorizing Provider  acetaminophen (TYLENOL) 500 MG tablet Take 500 mg by mouth every 6 (six) hours as needed for mild pain or fever.    Yes [provider]  Calcium Carbonate-Vitamin D 600-400 MG-UNIT tablet Take 1 tablet by mouth daily.   Yes [provider]  conjugated estrogens (PREMARIN) vaginal cream Place 1 Applicatorful vaginally 2 (two) times a week. Use 1/2 gram twice weekly 01/23/19  Yes Hildred Laser, MD  lisinopril (PRINIVIL,ZESTRIL) 10 MG tablet Take 10 mg by mouth daily.   Yes [provider]  Multiple Vitamins-Minerals (CENTRUM  SILVER 50+WOMEN) TABS Take 1 tablet by mouth daily.   Yes [provider]  omeprazole (PRILOSEC) 20 MG capsule Take 20 mg by mouth daily.   Yes [provider]  triamterene-hydrochlorothiazide (MAXZIDE-25) 37.5-25 MG tablet Take 1 tablet by mouth daily.    Yes [provider]  warfarin (COUMADIN) 4 MG tablet Take 4 mg by mouth every evening.    Yes [provider]   Allergies  Allergen Reactions    . Percocet [Oxycodone-Acetaminophen] Itching  . Sulfa Antibiotics Rash     FAMILY HISTORY   Family History  Problem Relation Age of Onset  . Breast cancer Paternal Aunt [id number]  . Kidney cancer Neg Hx   . Bladder Cancer Neg Hx       SOCIAL HISTORY    reports that she has never smoked. She has never used smokeless tobacco. She reports that she does not drink alcohol or use drugs.  Review of Systems  Constitutional: Positive for chills. Negative for fever.  Eyes: Negative for blurred vision and double vision.  Respiratory: Positive for shortness of breath. Negative for cough.   Cardiovascular: Negative for chest pain.  Gastrointestinal: Positive for abdominal pain (Epigastric, LUQ, LLQ). Negative for constipation, diarrhea and vomiting.  Musculoskeletal: Negative for neck pain.  Skin: Negative for rash.  Neurological: Negative for headaches.      VITAL SIGNS    Temp:  [97.4 F (36.3 C)-97.6 F (36.4 C)] 97.6 F (36.4 C) (01/04 0930) Pulse Rate:  [44-103] 87 (01/04 0930) Resp:  [16-44] 40 (01/04 0930) BP: (77-154)/(34-102) 78/42 (01/04 0930) SpO2:  [62 %-97 %] 89 % (01/04 0601) Weight:  [84.2 kg] 84.2 kg (01/04 0930) HEMODYNAMICS:    Supplemental Oxygen SETTINGS:   Non-rebreather mask on 15 L INTAKE / OUTPUT:  Intake/Output Summary (Last 24 hours) at 05/22/2019 1245 Last data filed at 05/22/2019 0655 Gross per 24 hour  Intake 300 ml  Output 1 ml  Net 299 ml       PHYSICAL EXAM   Physical Exam Constitutional:      Appearance: She is well-developed. She is ill-appearing.  Eyes:     General: No scleral icterus.    Pupils: Pupils are equal, round, and reactive to light.  Cardiovascular:     Rate and Rhythm: Regular rhythm. Tachycardia present.     Pulses:          Dorsalis pedis pulses are 1+ on the right side and 1+ on the left side.       Posterior tibial pulses are 1+ on the right side and 1+ on the left side.     Heart sounds: Normal heart sounds.   Pulmonary:     Comments: Diminished breath sounds in bilateral lung bases Abdominal:     General: Bowel sounds are decreased. There is no distension.     Palpations: Abdomen is soft.     Tenderness: There is abdominal tenderness in the right upper quadrant, right lower quadrant, epigastric area, left upper quadrant and left lower quadrant.  Feet:     Comments: Cool to touch bilaterally. Skin:    General: Skin is warm and dry.  Neurological:     Mental Status: She is alert.     Comments: Oriented to person and time        LABS   LABS:  CBC Recent Labs  Lab 05/20/19 1920 05/30/2019 0430 05/22/19 0428  WBC 10.5 11.1* 10.6*  HGB 14.5 15.9* 15.2*  HCT 41.6 48.3* 46.5*  PLT 191 194 169   Coag's Recent Labs  Lab 06/02/2019 0130 05/22/19 0428  INR 2.4* 3.6*   BMET Recent Labs  Lab 05/20/19 1920 05/24/2019 0430 05/22/19 0428  NA 140 142 142  K 3.3* 3.2* 5.4*  CL 105 111 113*  CO2 20* 19* 12*  BUN 30* 28* 51*  CREATININE 1.13* 1.09* 3.51*  GLUCOSE 180* 153* 82   Electrolytes Recent Labs  Lab 05/20/19 1920 05/30/2019 0430 05/22/19 0428  CALCIUM 9.8 7.6* 8.6*   Sepsis Markers Recent Labs  Lab 05/22/19 0700  LATICACIDVEN 7.7*   ABG Recent Labs  Lab 05/22/19 0843  PHART 7.17*  PCO2ART 30*  PO2ART <31.0*   Liver Enzymes Recent Labs  Lab 05/20/19 1920  AST 47*  ALT 27  ALKPHOS 50  BILITOT 0.8  ALBUMIN 3.9   Cardiac Enzymes No results for input(s): TROPONINI, PROBNP in the last 168 hours. Glucose Recent Labs  Lab 06/09/2019 1414 05/22/19 0557 05/22/19 0655 05/22/19 0752 05/22/19 0922  GLUCAP 143* 64* 109* 96 82     Recent Results (from the past 240 hour(s))  Urine culture     Status: Abnormal (Preliminary result)   Collection Time: 05/31/2019  1:00 AM   Specimen: Urine, Random  Result Value Ref Range Status   Specimen Description   Final    URINE, RANDOM Performed at [facility], [address] [place], [place] [address]     Special Requests   Final    NONE Performed at [facility], [address], [place], [place] [address]    Culture (A)  Final    >=100,000 COLONIES/mL ESCHERICHIA COLI SUSCEPTIBILITIES TO FOLLOW Performed at [facility], [address] [place], [place] [address]    Report Status PENDING  Incomplete  SARS CORONAVIRUS 2 (TAT 6-24 HRS) Nasopharyngeal Nasopharyngeal Swab     Status: None   Collection Time: 05/19/2019  2:24 AM   Specimen: Nasopharyngeal Swab  Result Value Ref Range Status   SARS Coronavirus 2 NEGATIVE NEGATIVE Final    Comment: (NOTE) SARS-CoV-2 target nucleic acids are NOT DETECTED. The SARS-CoV-2 RNA is generally detectable in upper and lower respiratory specimens during the acute phase of infection. Negative results do not preclude SARS-CoV-2 infection, do not rule out co-infections with other pathogens, and should not be used as the sole basis for treatment or other patient management decisions. Negative results must be combined with clinical observations, patient history, and epidemiological information. The expected result is Negative. Fact Sheet for Patients: HairSlick.no Fact Sheet for Healthcare Providers: quierodirigir.com This test is not yet approved or cleared by the [place] FDA and  has been authorized for detection and/or diagnosis of SARS-CoV-2 by FDA under an Emergency Use Authorization (EUA). This EUA will remain  in effect (meaning this test can be used) for the duration of the COVID-19 declaration under Section 56 4(b)(1) of the Act, 21 U.S.C. section 360bbb-3(b)(1), unless the authorization is terminated or revoked sooner. Performed at [facility], [address], [place], [place] [address]   MRSA PCR Screening     Status: None   Collection Time: 05/22/19  9:33 AM   Specimen: Nasopharyngeal  Result Value Ref Range Status   MRSA by PCR NEGATIVE NEGATIVE Final     Comment:        The GeneXpert MRSA Assay (FDA approved for NASAL specimens only), is one component of a comprehensive MRSA colonization surveillance program. It is not intended to diagnose MRSA infection  nor to guide or monitor treatment for MRSA infections. Performed at [facility], [address] [place], [place] [address]      Current Facility-Administered Medications:  .  acetaminophen (TYLENOL) tablet 650 mg, 650 mg, Oral, Q6H PRN **OR** acetaminophen (TYLENOL) suppository 650 mg, 650 mg, Rectal, Q6H PRN, Mansy, Jan A, MD .  Chlorhexidine Gluconate Cloth 2 % PADS 6 each, 6 each, Topical, Daily, Wyvonnia Dusky, MD, 6 each at 05/29/2019 2000 .  conjugated estrogens (PREMARIN) vaginal cream 1 Applicatorful, 1 Applicatorful, Vaginal, Once per day on Mon Thu, Mansy, Jan A, MD .  famotidine (PEPCID) IVPB 20 mg premix, 20 mg, Intravenous, Daily, Mansy, Jan A, MD .  fentaNYL (SUBLIMAZE) injection 25 mcg, 25 mcg, Intravenous, Q4H PRN, Mansy, Jan A, MD .  ketorolac (TORADOL) 15 MG/ML injection 15 mg, 15 mg, Intravenous, Q6H PRN, Mansy, Jan A, MD, 15 mg at 05/22/19 0047 .  magnesium hydroxide (MILK OF MAGNESIA) suspension 30 mL, 30 mL, Oral, Daily PRN, Mansy, Jan A, MD .  meropenem (MERREM) 500 mg in sodium chloride 0.9 % 100 mL IVPB, 500 mg, Intravenous, Q12H, Tyler Pita, MD .  metoprolol tartrate (LOPRESSOR) injection 5 mg, 5 mg, Intravenous, Q8H PRN, Wyvonnia Dusky, MD .  norepinephrine (LEVOPHED) 4mg  in 223mL premix infusion, 0-40 mcg/min, Intravenous, Titrated, Wyvonnia Dusky, MD .  ondansetron [facility]) tablet 4 mg, 4 mg, Oral, Q6H PRN **OR** ondansetron (ZOFRAN) injection 4 mg, 4 mg, Intravenous, Q6H PRN, Mansy, Jan A, MD, 4 mg at 05/31/2019 1308 .  pantoprazole (PROTONIX) EC tablet 40 mg, 40 mg, Oral, Daily, Mansy, Jan A, MD .  sodium bicarbonate 150 mEq in dextrose 5% 1000 mL infusion, 150 mEq, Intravenous, Continuous, Tyler Pita, MD .   traZODone (DESYREL) tablet 25 mg, 25 mg, Oral, QHS PRN, Mansy, Arvella Merles, MD .  Warfarin - Pharmacist Dosing Inpatient, , Does not apply, q1800, Mansy, Arvella Merles, MD  IMAGING      DG Chest Port 1 View  Result Date: 05/22/2019 CLINICAL DATA:  Central line placement. EXAM: PORTABLE CHEST 1 VIEW COMPARISON:  One-view chest x-ray 05/22/2019 at 8:52 a.m. FINDINGS: NG tube courses off the inferior border the film. And a left IJ line is in place. Tip is at the cavoatrial junction. Lung volumes are low. Bilateral pleural effusions are present. Bibasilar airspace opacities are again noted. IMPRESSION: 1. Left IJ line terminates at the cavoatrial junction. No radiographic evidence for complication. 2. Stable bilateral pleural effusions and bibasilar airspace disease. While this likely reflects atelectasis, infection is not excluded. Electronically Signed   By: San Morelle M.D.   On: 05/22/2019 10:48   DG Chest Port 1 View  Result Date: 05/22/2019 CLINICAL DATA:  Dyspnea EXAM: PORTABLE CHEST 1 VIEW COMPARISON:  06/11/2019 chest radiograph. FINDINGS: Enteric tube enters stomach with the tip not seen on this image. Stable cardiomediastinal silhouette with normal heart size. No pneumothorax. No pleural effusion. Low lung volumes. No overt pulmonary edema. Similar streaky opacities at both lung bases. IMPRESSION: 1.  Enteric tube enters stomach with the tip not seen on this image. 2. Low lung volumes with similar streaky opacities at both lung bases, favor atelectasis. Electronically Signed   By: Ilona Sorrel M.D.   On: 05/22/2019 09:14   DG Chest Port 1 View  Result Date: 05/22/2019 CLINICAL DATA:  Shortness of breath EXAM: PORTABLE CHEST 1 VIEW COMPARISON:  June 13, 2015 FINDINGS: The heart size is stable from prior study. Aortic calcifications  are noted. The enteric tube terminates over the gastric body. Bilateral pleural effusions and bibasilar airspace disease is noted. There is no pneumothorax. IMPRESSION:  1. Enteric tube projects over the gastric body. 2. Developing small bilateral pleural effusions with bibasilar atelectasis. Electronically Signed   By: Katherine Mantle M.D.   On: 05/31/2019 15:08      Indwelling Urinary Catheter continued, requirement due to   Reason to continue Indwelling Urinary Catheter for strict Intake/Output monitoring for hemodynamic instability   Central Line continued, requirement due to   Reason to continue [employer] Monitoring of central venous pressure or other hemodynamic parameters    MICRO DATA: MRSA PCR  Urine  Blood Resp   ANTIMICROBIALS: Zosyn 3.375 mg Q12 hours beginning 05/22/2019.  Discontinued Rocephin on 1/4.     ASSESSMENT/PLAN   Acute hypoxic respiratory failure - Bilateral pleural effusions and atelectasis in bilateral lung bases. - Patient compensating for metabolic acidosis with increased respiratory rate.  Bicarbonate administered.  Will monitor respiratory response. - Continue oxygen, switched to high flow -Bronchopulmonary hygiene, incentive spirometry.    Hypovolemic shock - Acute pancreatitis with associated hypotension (down to 84/37 today).   - Central line placed - Levophed 4 mg to maintain MAP>65 and SBP>90.   - Fluid resuscitation with normal saline and maintenance fluids of sodium bicarb.    Acute Pancreatitis with possible small bowel ileus - NPO - NG tube in place, draining  - IV Zosyn 3.375 mg BID. - Fentanyl 25 mcg PRN for pain every 4 hours  Urinary tract infection - Urinalysis consistent with leukocytes, hemoglobin and many bacteria.  E. Coli present on urine culture.   -Still awaiting sensitivity results.  Will adjust  - Antibiotics changed from Rocephin to Zosyn.    Metabolic Acidosis - Lactic acid elevated at 7.7, bicarb 10.9. Patient attempting to compensate with RR in low 30s and arterial CO2 at 30.  - Bicarbonate infusion 150 mEq in D5W solution.  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   - IV fluid resuscitation.  - Avoid nephrotoxic medications. - Continue to monitor BMP and urine.   - K of 5.4 today.   - Nephrology consult- consider therapeutic dialysis as necessary.     GI - NPO for pancreatitis. - Zofran 4 mg as needed for nausea.   -Pantoprazole 40 mg QD   DVT prophylaxis - History of DVT and PE in 2003 - Continuing warfarin home med     I have personally obtained a history, examined the patient, evaluated laboratory and independently reviewed  imaging results, formulated the assessment and plan and placed orders.  The Patient requires high complexity decision making for assessment and support, frequent evaluation and titration of therapies, application of advanced monitoring technologies and extensive interpretation of multiple databases. Critical Care Time devoted to patient care services described in this note is 60 minutes, excluding procedure time or other billable service.   Overall, patient is critically ill, prognosis is guarded. Patient at high risk for cardiac arrest and death.  Case discussed in detail with patient's daughters.   Prince Solian PA-S Acting as scribe for Dr. Georgetta Haber. Danice Goltz, MD [facility]  This note was dictated using voice recognition software/Dragon.  Despite best efforts to proofread, errors can occur which can change the meaning.  Any change was purely unintentional.

## 2019-05-23 ENCOUNTER — Inpatient Hospital Stay: Payer: Medicare HMO

## 2019-05-23 DIAGNOSIS — J9601 Acute respiratory failure with hypoxia: Secondary | ICD-10-CM

## 2019-05-23 DIAGNOSIS — Z515 Encounter for palliative care: Secondary | ICD-10-CM

## 2019-05-23 DIAGNOSIS — E861 Hypovolemia: Secondary | ICD-10-CM

## 2019-05-23 DIAGNOSIS — K8500 Idiopathic acute pancreatitis without necrosis or infection: Secondary | ICD-10-CM

## 2019-05-23 DIAGNOSIS — I9589 Other hypotension: Secondary | ICD-10-CM

## 2019-05-23 LAB — URINE CULTURE: Culture: >=100000 — AB

## 2019-05-23 LAB — COMPREHENSIVE METABOLIC PANEL
ALT: 78 U/L — ABNORMAL HIGH (ref 0–44)
AST: 234 U/L — ABNORMAL HIGH (ref 15–41)
Albumin: 2.2 g/dL — ABNORMAL LOW (ref 3.5–5.0)
Alkaline Phosphatase: 105 U/L (ref 38–126)
Anion gap: 15 (ref 5–15)
BUN: 65 mg/dL — ABNORMAL HIGH (ref 8–23)
CO2: 17 mmol/L — ABNORMAL LOW (ref 22–32)
Calcium: 6.8 mg/dL — ABNORMAL LOW (ref 8.9–10.3)
Chloride: 111 mmol/L (ref 98–111)
Creatinine, Ser: 3.32 mg/dL — ABNORMAL HIGH (ref 0.44–1.00)
GFR calc Af Amer: 14 mL/min — ABNORMAL LOW (ref >60)
GFR calc non Af Amer: 12 mL/min — ABNORMAL LOW (ref >60)
Glucose, Bld: 188 mg/dL — ABNORMAL HIGH (ref 70–99)
Potassium: 4.9 mmol/L (ref 3.5–5.1)
Sodium: 143 mmol/L (ref 135–145)
Total Bilirubin: 1.3 mg/dL — ABNORMAL HIGH (ref 0.3–1.2)
Total Protein: 5 g/dL — ABNORMAL LOW (ref 6.5–8.1)

## 2019-05-23 LAB — LIPASE, BLOOD: Lipase: 196 U/L — ABNORMAL HIGH (ref 11–51)

## 2019-05-23 LAB — CBC
HCT: 42 % (ref 36.0–46.0)
Hemoglobin: 13.5 g/dL (ref 12.0–15.0)
MCH: 32.5 pg (ref 26.0–34.0)
MCHC: 32.1 g/dL (ref 30.0–36.0)
MCV: 101.2 fL — ABNORMAL HIGH (ref 80.0–100.0)
Platelets: 114 10*3/uL — ABNORMAL LOW (ref 150–400)
RBC: 4.15 MIL/uL (ref 3.87–5.11)
RDW: 12.9 % (ref 11.5–15.5)
WBC: 7.3 10*3/uL (ref 4.0–10.5)
nRBC: 0 % (ref 0.0–0.2)

## 2019-05-23 LAB — PROTIME-INR
INR: 4.5 (ref 0.8–1.2)
Prothrombin Time: 42.7 seconds — ABNORMAL HIGH (ref 11.4–15.2)

## 2019-05-23 LAB — HEMOGLOBIN A1C
Hgb A1c MFr Bld: 6.1 % — ABNORMAL HIGH (ref 4.8–5.6)
Mean Plasma Glucose: 128 mg/dL

## 2019-05-23 LAB — LACTIC ACID, PLASMA
Lactic Acid, Venous: 7.2 mmol/L (ref 0.5–1.9)
Lactic Acid, Venous: 7.6 mmol/L (ref 0.5–1.9)

## 2019-05-23 MED ORDER — SODIUM CHLORIDE 0.9 % IV BOLUS
1000.0000 mL | Freq: Once | INTRAVENOUS | Status: AC
  Administered 2019-05-23: 1000 mL via INTRAVENOUS

## 2019-05-23 MED ORDER — ACETAMINOPHEN 325 MG PO TABS: 650.0000 mg | ORAL_TABLET | Freq: Four times a day (QID) | ORAL | Status: DC | PRN

## 2019-05-23 MED ORDER — DEXTROSE 5 % IV SOLN: INTRAVENOUS | Status: DC

## 2019-05-23 MED ORDER — ACETAMINOPHEN 650 MG RE SUPP: 650.0000 mg | Freq: Four times a day (QID) | RECTAL | Status: DC | PRN

## 2019-05-23 MED ORDER — SODIUM BICARBONATE 8.4 % IV SOLN
50.0000 meq | Freq: Once | INTRAVENOUS | Status: AC
  Filled 2019-05-23: qty 50

## 2019-05-23 MED ORDER — SODIUM BICARBONATE 8.4 % IV SOLN
50.0000 meq | Freq: Once | INTRAVENOUS | Status: AC
  Administered 2019-05-23: 50 meq via INTRAVENOUS
  Filled 2019-05-23: qty 50

## 2019-05-23 MED ORDER — GLYCOPYRROLATE 0.2 MG/ML IJ SOLN: 0.2000 mg | INTRAMUSCULAR | Status: DC | PRN

## 2019-05-23 MED ORDER — POLYVINYL ALCOHOL 1.4 % OP SOLN
1.0000 [drp] | Freq: Four times a day (QID) | OPHTHALMIC | Status: DC | PRN
  Filled 2019-05-23: qty 15

## 2019-05-23 MED ORDER — DIPHENHYDRAMINE HCL 50 MG/ML IJ SOLN: 25.0000 mg | INTRAMUSCULAR | Status: DC | PRN

## 2019-05-23 MED ORDER — GLYCOPYRROLATE 1 MG PO TABS
1.0000 mg | ORAL_TABLET | ORAL | Status: DC | PRN
  Filled 2019-05-23: qty 1

## 2019-05-23 MED ORDER — MORPHINE BOLUS VIA INFUSION
5.0000 mg | INTRAVENOUS | Status: DC | PRN
  Administered 2019-05-23: 19:00:00 5 mg via INTRAVENOUS
  Filled 2019-05-23: qty 5

## 2019-05-23 MED ORDER — MORPHINE 100MG IN NS 100ML (1MG/ML) PREMIX INFUSION
0.0000 mg/h | INTRAVENOUS | Status: DC
  Administered 2019-05-23: 19:00:00 5 mg/h via INTRAVENOUS
  Filled 2019-05-23: qty 100

## 2019-05-23 MED ORDER — MIDAZOLAM HCL 2 MG/2ML IJ SOLN: 2.0000 mg | INTRAMUSCULAR | Status: DC | PRN

## 2019-05-23 MED ORDER — CHLORHEXIDINE GLUCONATE 0.12 % MT SOLN: 15.0000 mL | Freq: Two times a day (BID) | OROMUCOSAL | Status: DC

## 2019-05-23 MED ORDER — ORAL CARE MOUTH RINSE
15.0000 mL | Freq: Two times a day (BID) | OROMUCOSAL | Status: DC
  Administered 2019-05-23: 15 mL via OROMUCOSAL

## 2019-05-23 MED ORDER — MORPHINE SULFATE (PF) 2 MG/ML IV SOLN: 2.0000 mg | INTRAVENOUS | Status: DC | PRN

## 2019-05-25 LAB — ANA

## 2019-06-19 NOTE — Progress Notes (Signed)
Patient expired at 1940, pronounced by Charge RN Fredonia Highland and Zollie Scale, RN.  Family's daughter Okey Regal notified as requested.  Body prepared, moisture patches placed over eyes.

## 2019-06-19 NOTE — Progress Notes (Signed)
[facility]  ROUNDING NOTE   Subjective:  Patient with worsening status this a.m. Currently on high flow nasal cannula. Urine output over the preceding 24 hours was 640 cc. Creatinine is down a bit to 3.32 with a BUN of 65. Acidosis partially corrected with serum bicarbonate of 17.   Objective:  Vital signs in last 24 hours:  Temp:  [97.6 F (36.4 C)-99.9 F (37.7 C)] 99.9 F (37.7 C) (01/05 0735) Pulse Rate:  [44-140] 51 (01/05 1000) Resp:  [21-44] 38 (01/05 1000) BP: (61-226)/(22-182) 137/53 (01/05 1000) SpO2:  [73 %-100 %] 91 % (01/05 1000) FiO2 (%):  [64 %-70 %] 65 % (01/05 0907)  Weight change:  Filed Weights   05/20/19 1909 05/22/19 0930  Weight: 77.1 kg 84.2 kg    Intake/Output: I/O last 3 completed shifts: In: 3688.8 [I.V.:2686.7; NG/GT:400; IV Piggyback:602.1] Out: 641 [Urine:640; Stool:1]   Intake/Output this shift:  No intake/output data recorded.  Physical Exam: General:  Critically ill-appearing  Head: Normocephalic, atraumatic. NG tube in place  Eyes: Anicteric  Neck: Supple, trachea midline  Lungs:   Scattered rales and rhonchi bilateral, tachypnea noted  Heart: S1S2 no rubs  Abdomen:  Distended, scant BS  Extremities: No peripheral edema.  Neurologic: Lethargic but arousable  Skin: No lesions       Basic Metabolic Panel: Recent Labs  Lab 05/29/2019 0430 05/22/19 0428 05/22/19 1239 05/22/19 1610 06/02/2019 0500  NA 142 142 142 144 143  K 3.2* 5.4* 5.4* 5.2* 4.9  CL 111 113* 114* 116* 111  CO2 19* 12* 10* 13* 17*  GLUCOSE 153* 82 69* 133* 188*  BUN 28* 51* 58* 61* 65*  CREATININE 1.09* 3.51* 3.89* 3.86* 3.32*  CALCIUM 7.6* 8.6* 7.8* 7.5* 6.8*  PHOS  --   --   --  5.3*  --     Liver Function Tests: Recent Labs  Lab 05/20/19 1920 05/22/19 1610 05/22/19 2239 Jun 02, 2019 0500  AST 47*  --  243* 234*  ALT 27  --  79* 78*  ALKPHOS 50  --  96 105  BILITOT 0.8  --  1.2 1.3*  PROT 8.2*  --  5.2* 5.0*  ALBUMIN 3.9 2.1*   2.2* 2.3* 2.2*   Recent Labs  Lab 05/20/19 1920 05/27/2019 0430 05/22/19 0428 02-Jun-2019 0500  LIPASE 1,352* 570* 774* 196*   No results for input(s): AMMONIA in the last 168 hours.  CBC: Recent Labs  Lab 05/20/19 1920 06/17/2019 0430 05/22/19 0428 2019-06-02 0500  WBC 10.5 11.1* 10.6* 7.3  HGB 14.5 15.9* 15.2* 13.5  HCT 41.6 48.3* 46.5* 42.0  MCV 94.5 99.4 100.0 101.2*  PLT 191 194 169 114*    Cardiac Enzymes: No results for input(s): CKTOTAL, CKMB, CKMBINDEX, TROPONINI in the last 168 hours.  BNP: Invalid input(s): POCBNP  CBG: Recent Labs  Lab 05/24/2019 1414 05/22/19 0557 05/22/19 0655 05/22/19 0752 05/22/19 0922  GLUCAP 143* 64* 109* 96 82    Microbiology: Results for orders placed or performed during the hospital encounter of 06/07/2019  Urine culture     Status: Abnormal   Collection Time: 05/29/2019  1:00 AM   Specimen: Urine, Random  Result Value Ref Range Status   Specimen Description   Final    URINE, RANDOM Performed at [facility], [address], [place], [place] [address]    Special Requests   Final    NONE Performed at [facility], [address], [place], [place] [address]    Culture >=  100,000 COLONIES/mL ESCHERICHIA COLI (A)  Final   Report Status 05-27-2019 FINAL  Final   Organism ID, Bacteria ESCHERICHIA COLI (A)  Final      Susceptibility   Escherichia coli - MIC*    AMPICILLIN <=2 SENSITIVE Sensitive     CEFAZOLIN <=4 SENSITIVE Sensitive     CEFTRIAXONE <=0.25 SENSITIVE Sensitive     CIPROFLOXACIN <=0.25 SENSITIVE Sensitive     GENTAMICIN <=1 SENSITIVE Sensitive     IMIPENEM <=0.25 SENSITIVE Sensitive     NITROFURANTOIN <=16 SENSITIVE Sensitive     TRIMETH/SULFA <=20 SENSITIVE Sensitive     AMPICILLIN/SULBACTAM <=2 SENSITIVE Sensitive     PIP/TAZO <=4 SENSITIVE Sensitive     * >=100,000 COLONIES/mL ESCHERICHIA COLI  SARS CORONAVIRUS 2 (TAT 6-24 HRS) Nasopharyngeal Nasopharyngeal Swab     Status: None   Collection  Time: 06/18/2019  2:24 AM   Specimen: Nasopharyngeal Swab  Result Value Ref Range Status   SARS Coronavirus 2 NEGATIVE NEGATIVE Final    Comment: (NOTE) SARS-CoV-2 target nucleic acids are NOT DETECTED. The SARS-CoV-2 RNA is generally detectable in upper and lower respiratory specimens during the acute phase of infection. Negative results do not preclude SARS-CoV-2 infection, do not rule out co-infections with other pathogens, and should not be used as the sole basis for treatment or other patient management decisions. Negative results must be combined with clinical observations, patient history, and epidemiological information. The expected result is Negative. Fact Sheet for Patients: SugarRoll.be Fact Sheet for Healthcare Providers: https://www.woods-mathews.com/ This test is not yet approved or cleared by the [place] FDA and  has been authorized for detection and/or diagnosis of SARS-CoV-2 by FDA under an Emergency Use Authorization (EUA). This EUA will remain  in effect (meaning this test can be used) for the duration of the COVID-19 declaration under Section 56 4(b)(1) of the Act, 21 U.S.C. section 360bbb-3(b)(1), unless the authorization is terminated or revoked sooner. Performed at [facility], [place] [address] [place], [place] [address]   MRSA PCR Screening     Status: None   Collection Time: 05/22/19  9:33 AM   Specimen: Nasopharyngeal  Result Value Ref Range Status   MRSA by PCR NEGATIVE NEGATIVE Final    Comment:        The GeneXpert MRSA Assay (FDA approved for NASAL specimens only), is one component of a comprehensive MRSA colonization surveillance program. It is not intended to diagnose MRSA infection nor to guide or monitor treatment for MRSA infections. Performed at [facility], [address] [place], [place] [address]     Coagulation Studies: Recent Labs    05/22/2019 0130 05/22/19 0428  05-27-2019 0500  LABPROT 25.8* 35.5* 42.7*  INR 2.4* 3.6* 4.5*    Urinalysis: Recent Labs    05/28/2019 0100  COLORURINE YELLOW*  LABSPEC 1.025  PHURINE 5.5  GLUCOSEU NEGATIVE  HGBUR SMALL*  BILIRUBINUR NEGATIVE  KETONESUR NEGATIVE  PROTEINUR NEGATIVE  NITRITE NEGATIVE  LEUKOCYTESUR MODERATE*      Imaging: DG Chest Port 1 View  Result Date: 05-27-19 CLINICAL DATA:  Respiratory distress EXAM: PORTABLE CHEST 1 VIEW COMPARISON:  05/22/2019 FINDINGS: Left jugular central venous catheter in the cavoatrial junction unchanged. NG in the stomach unchanged. Worsening aeration lungs. Increase in bilateral airspace disease. Increase in bilateral pleural effusions. IMPRESSION: Increase in bilateral pleural effusions and bilateral airspace disease. Probable heart failure Electronically Signed   By: Franchot Gallo M.D.   On: 05/27/2019 09:58   DG Chest [facility]  Result Date: 05/22/2019 CLINICAL DATA:  Central line placement. EXAM: PORTABLE CHEST 1 VIEW COMPARISON:  One-view chest x-ray 05/22/2019 at 8:52 a.m. FINDINGS: NG tube courses off the inferior border the film. And a left IJ line is in place. Tip is at the cavoatrial junction. Lung volumes are low. Bilateral pleural effusions are present. Bibasilar airspace opacities are again noted. IMPRESSION: 1. Left IJ line terminates at the cavoatrial junction. No radiographic evidence for complication. 2. Stable bilateral pleural effusions and bibasilar airspace disease. While this likely reflects atelectasis, infection is not excluded. Electronically Signed   By: San Morelle M.D.   On: 05/22/2019 10:48   DG Chest Port 1 View  Result Date: 05/22/2019 CLINICAL DATA:  Dyspnea EXAM: PORTABLE CHEST 1 VIEW COMPARISON:  05/31/2019 chest radiograph. FINDINGS: Enteric tube enters stomach with the tip not seen on this image. Stable cardiomediastinal silhouette with normal heart size. No pneumothorax. No pleural effusion. Low lung volumes. No overt  pulmonary edema. Similar streaky opacities at both lung bases. IMPRESSION: 1.  Enteric tube enters stomach with the tip not seen on this image. 2. Low lung volumes with similar streaky opacities at both lung bases, favor atelectasis. Electronically Signed   By: Ilona Sorrel M.D.   On: 05/22/2019 09:14   DG Chest Port 1 View  Result Date: 06/02/2019 CLINICAL DATA:  Shortness of breath EXAM: PORTABLE CHEST 1 VIEW COMPARISON:  June 13, 2015 FINDINGS: The heart size is stable from prior study. Aortic calcifications are noted. The enteric tube terminates over the gastric body. Bilateral pleural effusions and bibasilar airspace disease is noted. There is no pneumothorax. IMPRESSION: 1. Enteric tube projects over the gastric body. 2. Developing small bilateral pleural effusions with bibasilar atelectasis. Electronically Signed   By: Constance Holster M.D.   On: 06/03/2019 15:08     Medications:   . norepinephrine (LEVOPHED) Adult infusion 25 mcg/min (06-21-2019 0908)  . phenylephrine (NEO-SYNEPHRINE) Adult infusion Stopped (06-21-19 0146)  . piperacillin-tazobactam (ZOSYN)  IV Stopped (05/22/19 2348)  . sodium bicarbonate 150 mEq in dextrose 5% 1000 mL 125 mL/hr at 2019/06/21 0600  . vasopressin (PITRESSIN) infusion - *FOR SHOCK* 0.03 Units/min (06-21-19 0805)   . Chlorhexidine Gluconate Cloth  6 each Topical Daily  . conjugated estrogens  1 Applicatorful Vaginal Once per day on Mon Thu  . pantoprazole  40 mg Oral Daily  . Warfarin - Pharmacist Dosing Inpatient   Does not apply q1800   acetaminophen **OR** acetaminophen, fentaNYL (SUBLIMAZE) injection, magnesium hydroxide, metoprolol tartrate, ondansetron **OR** ondansetron (ZOFRAN) IV, traZODone  Assessment/ Plan:  79 y.o. female with past medical history of CKD stage 3A, hypertension, lower extremity edema, hyperlipidemia, osteoarthritis, hx of RLE DVT, pulmonary embolism, chronic microscopic hematuria, hx of cystocele admitted with pancreatitis  possibly due to HCTZ with mild ileus.    1.  Acute kidney injury/chronic kidney disease stage IIIa baseline EGFR 52.  Patient presents now with pancreatitis and ileus.  Suspect acute kidney injury secondary to ATN but there may be some component of contrast-induced nephropathy as well.  -Renal parameters have improved a bit.  Creatinine down to 3.32.  BUN up a bit to 65.  Continue IV fluid hydration with sodium bicarbonate drip.  2.  Metabolic acidosis.  Serum bicarbonate up to 17 today.  Maintain the patient on sodium bicarbonate drip.  3.  Hyperkalemia.  Potassium down to 4.9 today.  Continue to monitor closely.    LOS: 2 Resa Rinks 03-Feb-202110:24 AM

## 2019-06-19 NOTE — Progress Notes (Signed)
PROGRESS NOTE    [name]  YSA:[id number] DOB: 1940/09/09 DOA: 06/14/2019 PCP: Sherrie Mustache, MD      Assessment & Plan:   Active Problems:   Acute pancreatitis   Acute cystitis with hematuria   Hypokalemia   Hypotension due to hypovolemia   Acute lower UTI   Acute hypoxic respiratory failure: etiology unclear. Worsening. Continue on supplemental oxygen and wean as tolerated. CXR shows Increase in bilateral pleural effusions and bilateral airspace disease. Probable heart failure. Lasix as tolerated. Currently on HFNC.   Hypotension: continue on IV levophed, keep MAP >65.   AKI: etiology unclear, possibly secondary to ATN from hypotension. Cr is trending down today. Continue to hold home dose of lisinopril. Avoid nephrotoxic meds. Nephro following and recs apprec   Metabolic acidosis: continue on bicarb drip, improving. Nephro recs apprec   Hyperkalemia: resolved  Acute pancreatitis: possibly related to HCTZ with likely associated mild ileus. TG 61. NPO. Continue on IVFs.  Fentanyl prn for pain. Lipase is trending down today. D/c HCTZ in [place].    Lactic acidosis: labile. Continue on IVFs. Repeat lactic acid ordered  Likely ileus: NPO. S/p NG tube w/ low intermittent suction  UTI: urine cx is growing e. Coli. Changed to IV zosyn as per ICU  Leukocytosis: resolved  Elevated troponins: likely secondary to demand ischemia. No acute ischemic EKG changes. No CP.   Hyperglycemia: no hx of DM.  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 Keep INR between 2-3. Unable to take coumadin secondary to NPO w/ NG tube   Hypertension: will continue hold lisinopril secondary to AKI & hypotension and stop HCTZ in [place].  GERD: continue on IV pepcid  Transaminitis: AST & ALT trending up significantly. Etiology unclear. Will continue to monitor   Hyperbilirubinemia: etiology unclear. Will continue to monitor   DVT  prophylaxis: coumadin on hold while NPO. SCDs Code Status:  full Family Communication:  Disposition Plan:   Consultants:   ICU, nephro   Procedures:   Antimicrobials: zosyn    Subjective: Pt is lethargic and c/o shortness of breath.  Objective: Vitals:   06/13/19 0645 2019-06-13 0700 13-Jun-2019 0715 Jun 13, 2019 0735  BP: (!) 96/55 (!) 101/45 (!) 76/64 (!) 95/30  Pulse: (!) 107 (!) 115 (!) 111   Resp: (!) 41 (!) 28 (!) 26   Temp:    99.9 F (37.7 C)  TempSrc:    Oral  SpO2: 95% 97% 92%   Weight:      Height:        Intake/Output Summary (Last 24 hours) at 06/13/19 0850 Last data filed at 06/13/19 0600 Gross per 24 hour  Intake 3488.84 ml  Output 640 ml  Net 2848.84 ml   Filed Weights   05/20/19 1909 05/22/19 0930  Weight: 77.1 kg 84.2 kg    Examination:  General exam: Appears calm but uncomfortable  Respiratory system: diminished breath sounds b/l.  Cardiovascular system: S1 & S2 +. No , rubs, gallops or clicks.  Gastrointestinal system: Abdomen is mildly distended, soft and mild tenderness to palpation. Hypoactive bowel sounds heard. Central nervous system: Lethargic. Moves all 4 extremities. Psychiatry: Flat mood and affect     Data Reviewed: I have personally reviewed following labs and imaging studies  CBC: Recent Labs  Lab 05/20/19 1920 06/08/2019 0430 05/22/19 0428 06/13/2019 0500  WBC 10.5 11.1* 10.6* 7.3  HGB 14.5 15.9* 15.2* 13.5  HCT 41.6 48.3* 46.5* 42.0  MCV 94.5 99.4 100.0 101.2*  PLT 191 194 169 102*   Basic Metabolic Panel: Recent Labs  Lab 06/02/2019 0430 05/22/19 0428 05/22/19 1239 05/22/19 1610 Jun 03, 2019 0500  NA 142 142 142 144 143  K 3.2* 5.4* 5.4* 5.2* 4.9  CL 111 113* 114* 116* 111  CO2 19* 12* 10* 13* 17*  GLUCOSE 153* 82 69* 133* 188*  BUN 28* 51* 58* 61* 65*  CREATININE 1.09* 3.51* 3.89* 3.86* 3.32*  CALCIUM 7.6* 8.6* 7.8* 7.5* 6.8*  PHOS  --   --   --  5.3*  --    GFR: Estimated Creatinine Clearance: 13.6 mL/min (A)  (by C-G formula based on SCr of 3.32 mg/dL (H)). Liver Function Tests: Recent Labs  Lab 05/20/19 1920 05/22/19 1610 05/22/19 2239 2019/06/03 0500  AST 47*  --  243* 234*  ALT 27  --  79* 78*  ALKPHOS 50  --  96 105  BILITOT 0.8  --  1.2 1.3*  PROT 8.2*  --  5.2* 5.0*  ALBUMIN 3.9 2.1*  2.2* 2.3* 2.2*   Recent Labs  Lab 05/20/19 1920 06/03/2019 0430 05/22/19 0428 2019-06-03 0500  LIPASE 1,352* 570* 774* 196*   No results for input(s): AMMONIA in the last 168 hours. Coagulation Profile: Recent Labs  Lab 05/31/2019 0130 05/22/19 0428 2019-06-03 0500  INR 2.4* 3.6* 4.5*   Cardiac Enzymes: No results for input(s): CKTOTAL, CKMB, CKMBINDEX, TROPONINI in the last 168 hours. BNP (last 3 results) No results for input(s): PROBNP in the last 8760 hours. HbA1C: Recent Labs    05/22/19 0428  HGBA1C 6.1*   CBG: Recent Labs  Lab 05/30/2019 1414 05/22/19 0557 05/22/19 0655 05/22/19 0752 05/22/19 0922  GLUCAP 143* 64* 109* 96 82   Lipid Profile: Recent Labs    05/26/2019 0430  CHOL 110  HDL 52  LDLCALC 46  TRIG 61  CHOLHDL 2.1   Thyroid Function Tests: No results for input(s): TSH, T4TOTAL, FREET4, T3FREE, THYROIDAB in the last 72 hours. Anemia Panel: No results for input(s): VITAMINB12, FOLATE, FERRITIN, TIBC, IRON, RETICCTPCT in the last 72 hours. Sepsis Labs: Recent Labs  Lab 05/22/19 0700 05/22/19 1603 05/22/19 2239 June 03, 2019 0500  LATICACIDVEN 7.7* 7.0* 6.8* 7.2*    Recent Results (from the past 240 hour(s))  Urine culture     Status: Abnormal   Collection Time: 05/22/2019  1:00 AM   Specimen: Urine, Random  Result Value Ref Range Status   Specimen Description   Final    URINE, RANDOM Performed at [facility], [address] [place], [place] [address]    Special Requests   Final    NONE Performed at [facility], [address] [place], [place] [address]    Culture >=100,000 COLONIES/mL ESCHERICHIA COLI (A)  Final   Report Status  06-03-2019 FINAL  Final   Organism ID, Bacteria ESCHERICHIA COLI (A)  Final      Susceptibility   Escherichia coli - MIC*    AMPICILLIN <=2 SENSITIVE Sensitive     CEFAZOLIN <=4 SENSITIVE Sensitive     CEFTRIAXONE <=0.25 SENSITIVE Sensitive     CIPROFLOXACIN <=0.25 SENSITIVE Sensitive     GENTAMICIN <=1 SENSITIVE Sensitive     IMIPENEM <=0.25 SENSITIVE Sensitive     NITROFURANTOIN <=16 SENSITIVE Sensitive     TRIMETH/SULFA <=20 SENSITIVE Sensitive     AMPICILLIN/SULBACTAM <=2 SENSITIVE Sensitive     PIP/TAZO <=4 SENSITIVE Sensitive     * >=100,000 COLONIES/mL ESCHERICHIA COLI  SARS CORONAVIRUS  2 (TAT 6-24 HRS) Nasopharyngeal Nasopharyngeal Swab     Status: None   Collection Time: 06/01/2019  2:24 AM   Specimen: Nasopharyngeal Swab  Result Value Ref Range Status   SARS Coronavirus 2 NEGATIVE NEGATIVE Final    Comment: (NOTE) SARS-CoV-2 target nucleic acids are NOT DETECTED. The SARS-CoV-2 RNA is generally detectable in upper and lower respiratory specimens during the acute phase of infection. Negative results do not preclude SARS-CoV-2 infection, do not rule out co-infections with other pathogens, and should not be used as the sole basis for treatment or other patient management decisions. Negative results must be combined with clinical observations, patient history, and epidemiological information. The expected result is Negative. Fact Sheet for Patients: HairSlick.no Fact Sheet for Healthcare Providers: quierodirigir.com This test is not yet approved or cleared by the [place] FDA and  has been authorized for detection and/or diagnosis of SARS-CoV-2 by FDA under an Emergency Use Authorization (EUA). This EUA will remain  in effect (meaning this test can be used) for the duration of the COVID-19 declaration under Section 56 4(b)(1) of the Act, 21 U.S.C. section 360bbb-3(b)(1), unless the authorization is terminated  or revoked sooner. Performed at [facility], [address], [place], [place] [address]   MRSA PCR Screening     Status: None   Collection Time: 05/22/19  9:33 AM   Specimen: Nasopharyngeal  Result Value Ref Range Status   MRSA by PCR NEGATIVE NEGATIVE Final    Comment:        The GeneXpert MRSA Assay (FDA approved for NASAL specimens only), is one component of a comprehensive MRSA colonization surveillance program. It is not intended to diagnose MRSA infection nor to guide or monitor treatment for MRSA infections. Performed at [facility], [address], [place], [place] [address]          Radiology Studies: DG Chest [place] 1 View  Result Date: 05/22/2019 CLINICAL DATA:  Central line placement. EXAM: PORTABLE CHEST 1 VIEW COMPARISON:  One-view chest x-ray 05/22/2019 at 8:52 a.m. FINDINGS: NG tube courses off the inferior border the film. And a left IJ line is in place. Tip is at the cavoatrial junction. Lung volumes are low. Bilateral pleural effusions are present. Bibasilar airspace opacities are again noted. IMPRESSION: 1. Left IJ line terminates at the cavoatrial junction. No radiographic evidence for complication. 2. Stable bilateral pleural effusions and bibasilar airspace disease. While this likely reflects atelectasis, infection is not excluded. Electronically Signed   By: [place] M.D.   On: 05/22/2019 10:48   DG Chest Port 1 View  Result Date: 05/22/2019 CLINICAL DATA:  Dyspnea EXAM: PORTABLE CHEST 1 VIEW COMPARISON:  05/22/2019 chest radiograph. FINDINGS: Enteric tube enters stomach with the tip not seen on this image. Stable cardiomediastinal silhouette with normal heart size. No pneumothorax. No pleural effusion. Low lung volumes. No overt pulmonary edema. Similar streaky opacities at both lung bases. IMPRESSION: 1.  Enteric tube enters stomach with the tip not seen on this image. 2. Low lung volumes with similar streaky opacities at both  lung bases, favor atelectasis. Electronically Signed   By: Delbert Phenix M.D.   On: 05/22/2019 09:14   DG Chest Port 1 View  Result Date: 06/08/2019 CLINICAL DATA:  Shortness of breath EXAM: PORTABLE CHEST 1 VIEW COMPARISON:  June 13, 2015 FINDINGS: The heart size is stable from prior study. Aortic calcifications are noted. The enteric tube terminates over the gastric body. Bilateral pleural effusions and bibasilar airspace  disease is noted. There is no pneumothorax. IMPRESSION: 1. Enteric tube projects over the gastric body. 2. Developing small bilateral pleural effusions with bibasilar atelectasis. Electronically Signed   By: Katherine Mantle M.D.   On: 06-Jun-2019 15:08        Scheduled Meds: . Chlorhexidine Gluconate Cloth  6 each Topical Daily  . conjugated estrogens  1 Applicatorful Vaginal Once per day on Mon Thu  . pantoprazole  40 mg Oral Daily  . sodium bicarbonate  50 mEq Intravenous Once  . Warfarin - Pharmacist Dosing Inpatient   Does not apply q1800   Continuous Infusions: . norepinephrine (LEVOPHED) Adult infusion 26 mcg/min (05/31/2019 0810)  . phenylephrine (NEO-SYNEPHRINE) Adult infusion Stopped (06/05/2019 0146)  . piperacillin-tazobactam (ZOSYN)  IV Stopped (05/22/19 2348)  . sodium bicarbonate 150 mEq in dextrose 5% 1000 mL 125 mL/hr at 05/29/2019 0600  . vasopressin (PITRESSIN) infusion - *FOR SHOCK* 0.03 Units/min (06/15/2019 0805)     LOS: 2 days    Time spent: 32 mins    Charise Killian, MD [facility] Pager [phone]  If 7PM-7AM, please contact night-coverage [email or web address] Password [email or web address] 06/14/2019, 8:50 AM

## 2019-06-19 NOTE — Progress Notes (Addendum)
CRITICAL CARE PROGRESS NOTE    Name: [name] MRN: [id number] DOB: 09/25/1940     LOS: 2   SUBJECTIVE FINDINGS & SIGNIFICANT EVENTS   Patient description: 79 yo female with a history of PE/DVT/ GERD, HTN, CKD 3a, Cholecystectomy and hysterectomy who presented to the ED for abdominal pain on 06/10/2019, and was found to have acute pancreatitis (presumed d/t long-term use of HCTZ) with small bowel ileus and UTI.  She was admitted to the floor for bowel rest, IV rocephin, and fluid resuscitation.  On 05/22/2019, she developed metabolic acidosis, acute respiratory failure and hypovolemic shock and was transferred to the ICU.     Lines / Drains: NG tube Urethral catheter CVC triple lumen on left  Peripheral IV x2 in the left wrist and right forearm  Cultures / Sepsis markers: WBC improving from 10.6 to 7.3.  Patient remains afebrile.    Urine culture 01/03 positive for E. Coli.  Sensitive to zosyn.    Antibiotics: Zosyn 3.375 g Q12hrs   Protocols / Consultants: Nephrology  Tests / Events: 1/3- Patient presented to ED with abdominal pain and emesis. Elevated lipase of 1352, and CT consistent with acute pancreatitis. Urinalysis &culture consistent with e. Coli UTI 1/4- ICU consulted for hypotension, lethargy and decreased oxygen saturation.  Central line placed(LEFT IJ).   CC  follow up severe resp failure  HPI/Overnight: Patient became increasingly lethargic.  Continued hypotension and tachypnea.  Nurse reports episode of hallucinations with patient describing "ants on the wall."   Severe hypoxia FiO2 (%):  [64 %-70 %] 64 %    PAST MEDICAL HISTORY   Past Medical History:  Diagnosis Date  . DVT (deep venous thrombosis) (HCC)   . GERD (gastroesophageal reflux disease)   . Hypertension     . PE (pulmonary embolism)      SURGICAL HISTORY   Past Surgical History:  Procedure Laterality Date  . ABDOMINAL HYSTERECTOMY    . CHOLECYSTECTOMY    . KNEE ARTHROSCOPY Right    x 2  . KNEE SURGERY Left      FAMILY HISTORY   Family History  Problem Relation Age of Onset  . Breast cancer Paternal Aunt 1  . Kidney cancer Neg Hx   . Bladder Cancer Neg Hx      SOCIAL HISTORY   Social History   Tobacco Use  . Smoking status: Never Smoker  . Smokeless tobacco: Never Used  Substance Use Topics  . Alcohol use: No  . Drug use: No     MEDICATIONS   Current Medication:  Current Facility-Administered Medications:  .  acetaminophen (TYLENOL) tablet 650 mg, 650 mg, Oral, Q6H PRN **OR** acetaminophen (TYLENOL) suppository 650 mg, 650 mg, Rectal, Q6H PRN, Mansy, Jan A, MD .  Chlorhexidine Gluconate Cloth 2 % PADS 6 each, 6 each, Topical, Daily, Charise Killian, MD, 6 each at 05/22/19 1000 .  conjugated estrogens (PREMARIN) vaginal cream 1 Applicatorful, 1 Applicatorful, Vaginal, Once per day on Mon Thu, Mansy, Jan A, MD .  fentaNYL (SUBLIMAZE) injection 25 mcg, 25 mcg, Intravenous, Q4H PRN, Mansy, Vernetta Honey, MD, 25 mcg at 05/22/19 2253 .  magnesium hydroxide (MILK OF MAGNESIA) suspension 30 mL, 30 mL, Oral, Daily PRN, Mansy, Jan A, MD .  metoprolol tartrate (LOPRESSOR) injection 5 mg, 5 mg, Intravenous, Q8H PRN, Charise Killian, MD, 5 mg at May 29, 2019 0247 .  norepinephrine (LEVOPHED) 16 mg in premix infusion, 0-40 mcg/min, Intravenous, Titrated, Blakeney, Neldon Newport, NP, Last Rate:  23.4 mL/hr at 06/21/2019 0908, 25 mcg/min at 21-Jun-2019 0908 .  ondansetron (ZOFRAN) tablet 4 mg, 4 mg, Oral, Q6H PRN **OR** ondansetron (ZOFRAN) injection 4 mg, 4 mg, Intravenous, Q6H PRN, Mansy, Jan A, MD, 4 mg at 06/06/2019 1308 .  pantoprazole (PROTONIX) EC tablet 40 mg, 40 mg, Oral, Daily, Mansy, Jan A, MD .  phenylephrine CONCENTRATED 100mg  in sodium chloride 0.9% (0.4mg /mL) infusion,  0-400 mcg/min, Intravenous, Titrated, , NP, Stopped at June 21, 2019 0146 .  piperacillin-tazobactam (ZOSYN) IVPB 3.375 g, 3.375 g, Intravenous, Q12H, 07/21/19, MD, Stopped at 05/22/19 2348 .  sodium bicarbonate 150 mEq in dextrose 5% 1000 mL infusion, 150 mEq, Intravenous, Continuous, 2349, MD, Last Rate: 125 mL/hr at June 21, 2019 0600, Rate Verify at 06-21-2019 0600 .  traZODone (DESYREL) tablet 25 mg, 25 mg, Oral, QHS PRN, Mansy, Jan A, MD .  vasopressin (PITRESSIN) 40 Units in sodium chloride 0.9 % 250 mL (0.16 Units/mL) infusion, 0.03 Units/min, Intravenous, Continuous, 07/21/19, MD, Last Rate: 11.25 mL/hr at Jun 21, 2019 0805, 0.03 Units/min at 06/21/2019 0805 .  Warfarin - Pharmacist Dosing Inpatient, , Does not apply, q1800, Mansy, 07/21/19, MD    ALLERGIES   Percocet [oxycodone-acetaminophen] and Sulfa antibiotics    REVIEW OF SYSTEMS   Unable to obtain due to severity of patient illness.    PHYSICAL EXAMINATION   Vital Signs: Temp:  [97.6 F (36.4 C)-99.9 F (37.7 C)] 99.9 F (37.7 C) (01/05 0735) Pulse Rate:  [44-140] 116 (01/05 0800) Resp:  [21-44] 35 (01/05 0800) BP: (61-226)/(22-182) 102/42 (01/05 0800) SpO2:  [73 %-100 %] 97 % (01/05 0800) FiO2 (%):  [64 %-70 %] 65 % (01/05 0907)  GENERAL: Patient is ill-appearing and increasingly lethargic.   HEAD: Normocephalic, atraumatic.  EYES: Pupils equal, round, reactive to light.  No scleral icterus.  MOUTH: Moist mucosal membrane. NECK: Supple. No thyromegaly. No nodules. No JVD.  PULMONARY: Expiratory wheezes bilaterally, diminished at lung bases.  Tachypnic at 21-44 bpm.  SpO2 in low 90s on 15 L high flow nasal canula.   CARDIOVASCULAR: S1 and S2. Regular rate and rhythm. No murmurs, rubs, or gallops.  GASTROINTESTINAL: Soft, pain to palpation, especially in LUQ, RUQ and epigastric area, non-distended. No masses. Positive bowel sounds. No hepatosplenomegaly.  MUSCULOSKELETAL: No  swelling, clubbing, or edema. Cool hands and feet.  Dorsalis pedis pulses 1+ bilaterally.   NEUROLOGIC: Mild distress due to acute illness, Patient opens eyes in response to voice.  Follows simple commands.  Patient is alert, but is not oriented to person, place nor time.   SKIN:intact,warm,dry   PERTINENT DATA     Infusions: . norepinephrine (LEVOPHED) Adult infusion 25 mcg/min (2019/06/21 0908)  . phenylephrine (NEO-SYNEPHRINE) Adult infusion Stopped (Jun 21, 2019 0146)  . piperacillin-tazobactam (ZOSYN)  IV Stopped (05/22/19 2348)  . sodium bicarbonate 150 mEq in dextrose 5% 1000 mL 125 mL/hr at 06/21/2019 0600  . vasopressin (PITRESSIN) infusion - *FOR SHOCK* 0.03 Units/min (Jun 21, 2019 0805)   Scheduled Medications: . Chlorhexidine Gluconate Cloth  6 each Topical Daily  . conjugated estrogens  1 Applicatorful Vaginal Once per day on Mon Thu  . pantoprazole  40 mg Oral Daily  . Warfarin - Pharmacist Dosing Inpatient   Does not apply q1800   PRN Medications: acetaminophen **OR** acetaminophen, fentaNYL (SUBLIMAZE) injection, magnesium hydroxide, metoprolol tartrate, ondansetron **OR** ondansetron (ZOFRAN) IV, traZODone Hemodynamic parameters: CVP:  [5 mmHg-21 mmHg] 9 mmHg Intake/Output: 01/04 0701 - 01/05 0700 In: 3488.8 [I.V.:2686.7; NG/GT:200; IV Piggyback:602.1] Out: 640 [  Urine:640]  Ventilator  Settings: FiO2 (%):  [64 %-70 %] 65 %   LAB RESULTS:  Basic Metabolic Panel: Recent Labs  Lab 06/14/2019 0430 05/22/19 0428 05/22/19 1239 05/22/19 1610 May 27, 2019 0500  NA 142 142 142 144 143  K 3.2* 5.4* 5.4* 5.2* 4.9  CL 111 113* 114* 116* 111  CO2 19* 12* 10* 13* 17*  GLUCOSE 153* 82 69* 133* 188*  BUN 28* 51* 58* 61* 65*  CREATININE 1.09* 3.51* 3.89* 3.86* 3.32*  CALCIUM 7.6* 8.6* 7.8* 7.5* 6.8*  PHOS  --   --   --  5.3*  --    Liver Function Tests: Recent Labs  Lab 05/20/19 1920 05/22/19 1610 05/22/19 2239 May 27, 2019 0500  AST 47*  --  243* 234*  ALT 27  --  79* 78*    ALKPHOS 50  --  96 105  BILITOT 0.8  --  1.2 1.3*  PROT 8.2*  --  5.2* 5.0*  ALBUMIN 3.9 2.1*  2.2* 2.3* 2.2*   Recent Labs  Lab 05/20/19 1920 06/15/2019 0430 05/22/19 0428 05-27-2019 0500  LIPASE 1,352* 570* 774* 196*   No results for input(s): AMMONIA in the last 168 hours. CBC: Recent Labs  Lab 05/20/19 1920 05/19/2019 0430 05/22/19 0428 May 27, 2019 0500  WBC 10.5 11.1* 10.6* 7.3  HGB 14.5 15.9* 15.2* 13.5  HCT 41.6 48.3* 46.5* 42.0  MCV 94.5 99.4 100.0 101.2*  PLT 191 194 169 114*   Cardiac Enzymes: No results for input(s): CKTOTAL, CKMB, CKMBINDEX, TROPONINI in the last 168 hours. BNP: Invalid input(s): POCBNP CBG: Recent Labs  Lab 05/25/2019 1414 05/22/19 0557 05/22/19 0655 05/22/19 0752 05/22/19 0922  GLUCAP 143* 64* 109* 96 82     IMAGING RESULTS:  Imaging:  DG Chest Port 1 View  Result Date: 05/22/2019 CLINICAL DATA:  Central line placement. EXAM: PORTABLE CHEST 1 VIEW COMPARISON:  One-view chest x-ray 05/22/2019 at 8:52 a.m. FINDINGS: NG tube courses off the inferior border the film. And a left IJ line is in place. Tip is at the cavoatrial junction. Lung volumes are low. Bilateral pleural effusions are present. Bibasilar airspace opacities are again noted. IMPRESSION: 1. Left IJ line terminates at the cavoatrial junction. No radiographic evidence for complication. 2. Stable bilateral pleural effusions and bibasilar airspace disease. While this likely reflects atelectasis, infection is not excluded. Electronically Signed   By: Marin Roberts M.D.   On: 05/22/2019 10:48   DG Chest Port 1 View  Result Date: 05/22/2019 CLINICAL DATA:  Dyspnea EXAM: PORTABLE CHEST 1 VIEW COMPARISON:  05/20/2019 chest radiograph. FINDINGS: Enteric tube enters stomach with the tip not seen on this image. Stable cardiomediastinal silhouette with normal heart size. No pneumothorax. No pleural effusion. Low lung volumes. No overt pulmonary edema. Similar streaky opacities at both lung  bases. IMPRESSION: 1.  Enteric tube enters stomach with the tip not seen on this image. 2. Low lung volumes with similar streaky opacities at both lung bases, favor atelectasis. Electronically Signed   By: Delbert Phenix M.D.   On: 05/22/2019 09:14   DG Chest Port 1 View  Result Date: 05/30/2019 CLINICAL DATA:  Shortness of breath EXAM: PORTABLE CHEST 1 VIEW COMPARISON:  June 13, 2015 FINDINGS: The heart size is stable from prior study. Aortic calcifications are noted. The enteric tube terminates over the gastric body. Bilateral pleural effusions and bibasilar airspace disease is noted. There is no pneumothorax. IMPRESSION: 1. Enteric tube projects over the gastric body. 2. Developing small bilateral pleural effusions with  bibasilar atelectasis. Electronically Signed   By: Constance Holster M.D.   On: 06/10/2019 15:08   @PROBHOSP @   ASSESSMENT AND PLAN    -Multidisciplinary rounds held today  Acute Hypoxic Respiratory Failure - CXR today demonstrates worsening bilateral pleural effusions.  Patient remains tachyonic with SpO2 in low 90s on high flow nasal canula.   - Patient likely compensating for metabolic acidosis with increased respiratory rate.  Bicarbonate administered.   - Due to severity of patient illness, plan to have goals of care discussion with patient's family.  Plan to discuss goals of care, including intubation and code status.    CARDIAC FAILURE -oxygen as needed -Lasix as tolerated -follow up cardiac enzymes as indicated ICU monitoring  Hypovolemic shock - Acute pancreatitis with associated hypotension (down to 61/52 overnight).   - BP support with levophed, phenylephrine, vasopressin.    - Consider albumin infusion.    Acute pancreatitis with possible small bowel ileus - No prior personal history of pancreatitis.  Patient denies alcohol use, s/p cholecystectomy, normal triglycerides.  Assumed d/t long-term use of HCTZ. - ANA today to r/o autoimmune pancreatitis.     - Patient NPO, NG tube in place -Fentanyl 20 mcg PRN for pain Q4hrs - IV Zosyn 6.063 mg BID  Metabolic acidosis - pH 0.16 yesterday with pCO2 30, bicarbonate 10.9.  Patient continues to receive sodium bicarbonate 150 mEq in D5W at 125 mL/hr since yesterday.  She continues to exhibit tachypnea, compensating by attempting to "blow off" CO2. - Continue Sodium bicarb infusion. - Repeat blood gas today.    Acute Renal Failure on CKD 3a-most likely due to combination of contrast-induced nephropathy and ATN - Under care of nephrology.  - Creatinine 3.3, GFR 14.  Potassium improved slightly with sodium bicarb administration from 5.2 to 4.9.   - Due to fluid overload, CRRT will be initiated -follow chem 7 -follow UO -continue Foley Catheter-assess need daily  ID -continue IV abx as prescribed: Zosyn 3.375 g BID since 05/22/2019 -follow up cultures  GI/Nutrition GI PROPHYLAXIS as indicated DIET-->NPO for pancreatitis Zofran 4 mg PRN for nausea Constipation protocol as indicated  ENDO - ICU hypoglycemic\Hyperglycemia protocol -check FSBS per protocol   ELECTROLYTES -follow labs as needed -replace as needed -pharmacy consultation   DVT/GI PRX ordered - History of DVT/PE in 2003 - Elevated PT/INR today.  Will monitor and consider changing to heparin tomorrow.   TRANSFUSIONS AS NEEDED MONITOR FSBS ASSESS the need for LABS as needed    Critical Care Time devoted to patient care services described in this note is 32 minutes.   Overall, patient is critically ill, prognosis is guarded.  Patient with Multiorgan failure and at high risk for cardiac arrest and death.   Recommend DNR status, consider palliative consult.  Corrin Parker, M.D.  [facility]  Medical Director [place] Director [facility] Cardio-Pulmonary Department

## 2019-06-19 NOTE — Death Summary Note (Signed)
DEATH SUMMARY   Patient Details  Name: [name] MRN: [id number] DOB: 1941/05/09  Admission/Discharge Information   Admit Date:  05/26/19  Date of Death: Date of Death: May 28, 2019  Time of Death: Time of Death: 1940  Length of Stay: 2  Referring Physician: Sherrie Mustache, MD   Reason(s) for Hospitalization  Acute Pancreatitis  Acute Cystitis Acute Hypoxic Respiratory Failure Hypovolemic shock Metabolic Acidosis  Diagnoses  Preliminary cause of death: Acute Hypoxic Respiratory Failure Secondary Diagnoses (including complications and co-morbidities):  Active Problems:   Acute pancreatitis   Acute cystitis with hematuria   Hypokalemia   Hypotension due to hypovolemia   Acute lower UTI   Palliative care status   Brief Hospital Course (including significant findings, care, treatment, and services provided and events leading to death)  [name] is a 79 y.o. year old female who has a past medical history of PE/DVT, GERD, hypertension, cholecystectomy and hysterectomy who presented to the ED on 05-26-19 after developing sudden onset of sharp epigastric abdominal pain starting at 1:00 PM yesterday and several episodes of emesis without hematemesis or bilious vomit. She denied fevers, chills, chest pain, changes in urination, and changes in bowel movements.   ED workup demonstrated an elevated lipase of 1352. CT abdomen/pelvis showed peripancreatic fat stranding and surrounding free fluid, and distension in proximal duodenum, stomach and distal esophagus, consistent with acute pancreatitis with possible ileus. Patient has no prior history of pancreatitis, and denies history of alcohol use, and is s/p cholecystectomy, triglycerides within normal range at 61. Pancreatitis possibly attributed to long-term use of HCTZ.   Further ED workup, demonstrated a urinalysis with large leukocytes, moderate hemoglobin, and many bacteria. Urine culture consistent with E. Coli  cystitis. In addition, PT and INR were elevated at 25.8 and 2.4 respectively. Patient on warfarin 4 mg QD. Troponin elevated at 11 and then 30. EKG showed normal sinus rhythm with no evidence of acute changes.   Patient admitted by Dr. Valente David on May 25, 2018. She was made NPO, and IV fluid resuscitation with  normal saline was initiated. HCTZ discontinued. IV rocephin initiated for UTI.   On 05/22/19 Lactic acid elevated to 7.7. Patient exhibiting metabolic acidosis with pH of 7.17 pCO2 of 30, and bicarbonate of 10.9 in venous blood gas (unable to collect ABG due to [place] test). Creatinine elevated at 3.89, GFR 12.  She was in respiratory distress and hypotensive.  We were asked to evaluate and assist with regards to critical care management.  On 2019/05/28, she became increasingly lethargic, along with continued hypotension, hypoxia, and tachypnea.  After family discussion with Dr. Belia Heman, family made decision to withdraw care and proceed with comfort measures.  Pertinent Labs and Studies  Significant Diagnostic Studies CT ABDOMEN PELVIS W CONTRAST  Result Date: 26-May-2019 CLINICAL DATA:  Epigastric and upper abdominal pain for 2 hours. EXAM: CT ABDOMEN AND PELVIS WITH CONTRAST TECHNIQUE: Multidetector CT imaging of the abdomen and pelvis was performed using the standard protocol following bolus administration of intravenous contrast. CONTRAST:  OMNIPAQUE IOHEXOL 300 MG/ML  SOLN COMPARISON:  CT 06/21/2013 FINDINGS: Lower chest: Bibasilar atelectasis, right greater than left. Suspected bibasilar bronchiectasis. No pleural fluid. Hepatobiliary: No focal hepatic abnormality. Postcholecystectomy. No biliary dilatation. No visualized choledocholithiasis. Pancreas: Moderate to severe peripancreatic fat stranding and free fluid about the uncinate process, head, and proximal body of the pancreas. Moderate non organized free fluid in the upper abdomen. No evidence of pancreatic necrosis. No organized  fluid collection. No ductal dilatation.  No visualized pancreatic mass. Spleen: Normal in size without focal abnormality. Adrenals/Urinary Tract: Normal adrenal glands. Multiple bilateral renal cysts. No hydronephrosis. No evidence of solid renal lesion. Homogeneous renal enhancement with symmetric excretion on delayed phase imaging. Urinary bladder is physiologically distended. No bladder wall thickening. Stomach/Bowel: Distended distal esophagus with intraluminal fluid. Fluid-filled distended stomach. No gastric wall thickening. Prominent fluid-filled duodenum without duodenal wall thickening. Proximal jejunum also fluid-filled. There is no small bowel wall thickening or inflammation. No obstruction. Normal appendix. Small volume of stool in the colon. No colonic wall thickening. Vascular/Lymphatic: Prominent portal caval and peripancreatic nodes, all subcentimeter. Portal vein is patent. Mesenteric vessels are patent. Aortic atherosclerosis without aneurysm. Reproductive: Pessary in place. Hysterectomy. No adnexal mass. Other: Moderate volume of free fluid in the upper abdomen about the pancreas, duodenum, and tracking in the right upper quadrant. Small amount of free fluid in the right pericolic gutter and pelvis. No free air. No organized collection. Musculoskeletal: Multilevel degenerative change in the spine. There are no acute or suspicious osseous abnormalities. IMPRESSION: 1. Acute pancreatitis with moderate to severe peripancreatic fat stranding and free fluid. No evidence of pancreatic necrosis or organized fluid collection. 2. Fluid-filled distended distal esophagus, stomach and proximal small bowel, likely secondary to pancreatitis. Aortic Atherosclerosis (ICD10-I70.0). Electronically Signed   By: Narda Rutherford M.D.   On: 05/29/2019 01:59   US RENAL  Result Date: 06-16-2019 CLINICAL DATA:  Acute chronic renal failure. EXAM: RENAL / URINARY TRACT ULTRASOUND COMPLETE COMPARISON:  CT 05/27/2019.   Ultrasound 04/03/2019. FINDINGS: Right Kidney: Renal measurements: 11.7 x 6.2 x 6.2 cm = volume: 232 mL . Echogenicity within normal limits. Multiple simple cysts, the largest measures 3.8 cm. No hydronephrosis visualized. Left Kidney: Renal measurements: 11.5 x 5.8 x 4.9 cm = volume: 172 mL. Echogenicity within normal limits. Multiple simple cysts, the largest measures 5.2 cm. No hydronephrosis visualized. Bladder: Bladder is nondistended.  Foley catheter present. Other: Bilateral pleural effusions. IMPRESSION: 1. Multiple bilateral simple renal cysts again noted. No acute renal abnormality identified. No hydronephrosis. Bladder is nondistended. Foley catheter present. 2.  Bilateral pleural effusions. Electronically Signed   By: Maisie Fus  Register   On: 06-16-19 13:52   DG Chest Port 1 View  Result Date: Jun 16, 2019 CLINICAL DATA:  Respiratory distress EXAM: PORTABLE CHEST 1 VIEW COMPARISON:  05/22/2019 FINDINGS: Left jugular central venous catheter in the cavoatrial junction unchanged. NG in the stomach unchanged. Worsening aeration lungs. Increase in bilateral airspace disease. Increase in bilateral pleural effusions. IMPRESSION: Increase in bilateral pleural effusions and bilateral airspace disease. Probable heart failure Electronically Signed   By: Marlan Palau M.D.   On: 16-Jun-2019 09:58   DG Chest Port 1 View  Result Date: 05/22/2019 CLINICAL DATA:  Central line placement. EXAM: PORTABLE CHEST 1 VIEW COMPARISON:  One-view chest x-ray 05/22/2019 at 8:52 a.m. FINDINGS: NG tube courses off the inferior border the film. And a left IJ line is in place. Tip is at the cavoatrial junction. Lung volumes are low. Bilateral pleural effusions are present. Bibasilar airspace opacities are again noted. IMPRESSION: 1. Left IJ line terminates at the cavoatrial junction. No radiographic evidence for complication. 2. Stable bilateral pleural effusions and bibasilar airspace disease. While this likely reflects  atelectasis, infection is not excluded. Electronically Signed   By: Marin Roberts M.D.   On: 05/22/2019 10:48   DG Chest Port 1 View  Result Date: 05/22/2019 CLINICAL DATA:  Dyspnea EXAM: PORTABLE CHEST 1 VIEW COMPARISON:  06/12/2019 chest radiograph. FINDINGS: Enteric  tube enters stomach with the tip not seen on this image. Stable cardiomediastinal silhouette with normal heart size. No pneumothorax. No pleural effusion. Low lung volumes. No overt pulmonary edema. Similar streaky opacities at both lung bases. IMPRESSION: 1.  Enteric tube enters stomach with the tip not seen on this image. 2. Low lung volumes with similar streaky opacities at both lung bases, favor atelectasis. Electronically Signed   By: Delbert Phenix M.D.   On: 05/22/2019 09:14   DG Chest Port 1 View  Result Date: 03-Jun-2019 CLINICAL DATA:  Shortness of breath EXAM: PORTABLE CHEST 1 VIEW COMPARISON:  June 13, 2015 FINDINGS: The heart size is stable from prior study. Aortic calcifications are noted. The enteric tube terminates over the gastric body. Bilateral pleural effusions and bibasilar airspace disease is noted. There is no pneumothorax. IMPRESSION: 1. Enteric tube projects over the gastric body. 2. Developing small bilateral pleural effusions with bibasilar atelectasis. Electronically Signed   By: Katherine Mantle M.D.   On: 2019/06/03 15:08    Microbiology Recent Results (from the past 240 hour(s))  Urine culture     Status: Abnormal   Collection Time: 06-03-19  1:00 AM   Specimen: Urine, Random  Result Value Ref Range Status   Specimen Description   Final    URINE, RANDOM Performed at [facility], [address] [place], [place] [address]    Special Requests   Final    NONE Performed at [facility], [address] [place], [place] [address]    Culture >=100,000 COLONIES/mL ESCHERICHIA COLI (A)  Final   Report Status 06/05/2019 FINAL  Final   Organism ID, Bacteria ESCHERICHIA COLI  (A)  Final      Susceptibility   Escherichia coli - MIC*    AMPICILLIN <=2 SENSITIVE Sensitive     CEFAZOLIN <=4 SENSITIVE Sensitive     CEFTRIAXONE <=0.25 SENSITIVE Sensitive     CIPROFLOXACIN <=0.25 SENSITIVE Sensitive     GENTAMICIN <=1 SENSITIVE Sensitive     IMIPENEM <=0.25 SENSITIVE Sensitive     NITROFURANTOIN <=16 SENSITIVE Sensitive     TRIMETH/SULFA <=20 SENSITIVE Sensitive     AMPICILLIN/SULBACTAM <=2 SENSITIVE Sensitive     PIP/TAZO <=4 SENSITIVE Sensitive     * >=100,000 COLONIES/mL ESCHERICHIA COLI  SARS CORONAVIRUS 2 (TAT 6-24 HRS) Nasopharyngeal Nasopharyngeal Swab     Status: None   Collection Time: Jun 03, 2019  2:24 AM   Specimen: Nasopharyngeal Swab  Result Value Ref Range Status   SARS Coronavirus 2 NEGATIVE NEGATIVE Final    Comment: (NOTE) SARS-CoV-2 target nucleic acids are NOT DETECTED. The SARS-CoV-2 RNA is generally detectable in upper and lower respiratory specimens during the acute phase of infection. Negative results do not preclude SARS-CoV-2 infection, do not rule out co-infections with other pathogens, and should not be used as the sole basis for treatment or other patient management decisions. Negative results must be combined with clinical observations, patient history, and epidemiological information. The expected result is Negative. Fact Sheet for Patients: HairSlick.no Fact Sheet for Healthcare Providers: quierodirigir.com This test is not yet approved or cleared by the [place] FDA and  has been authorized for detection and/or diagnosis of SARS-CoV-2 by FDA under an Emergency Use Authorization (EUA). This EUA will remain  in effect (meaning this test can be used) for the duration of the COVID-19 declaration under Section 56 4(b)(1) of the Act, 21 U.S.C. section 360bbb-3(b)(1), unless the authorization is terminated or revoked sooner. Performed at [facility],  [address] [place], [place] [address]   MRSA PCR Screening     Status: None   Collection Time: 05/22/19  9:33 AM   Specimen: Nasopharyngeal  Result Value Ref Range Status   MRSA by PCR NEGATIVE NEGATIVE Final    Comment:        The GeneXpert MRSA Assay (FDA approved for NASAL specimens only), is one component of a comprehensive MRSA colonization surveillance program. It is not intended to diagnose MRSA infection nor to guide or monitor treatment for MRSA infections. Performed at [facility], [address] [place], [place] [address]     Lab Basic Metabolic Panel: Recent Labs  Lab May 23, 2019 0430 05/22/19 0428 05/22/19 1239 05/22/19 1610 06/08/2019 0500  NA 142 142 142 144 143  K 3.2* 5.4* 5.4* 5.2* 4.9  CL 111 113* 114* 116* 111  CO2 19* 12* 10* 13* 17*  GLUCOSE 153* 82 69* 133* 188*  BUN 28* 51* 58* 61* 65*  CREATININE 1.09* 3.51* 3.89* 3.86* 3.32*  CALCIUM 7.6* 8.6* 7.8* 7.5* 6.8*  PHOS  --   --   --  5.3*  --    Liver Function Tests: Recent Labs  Lab 05/20/19 1920 05/22/19 1610 05/22/19 2239 05/22/2019 0500  AST 47*  --  243* 234*  ALT 27  --  79* 78*  ALKPHOS 50  --  96 105  BILITOT 0.8  --  1.2 1.3*  PROT 8.2*  --  5.2* 5.0*  ALBUMIN 3.9 2.1*  2.2* 2.3* 2.2*   Recent Labs  Lab 05/20/19 1920 06/06/2019 0430 05/22/19 0428 05/27/2019 0500  LIPASE 1,352* 570* 774* 196*   No results for input(s): AMMONIA in the last 168 hours. CBC: Recent Labs  Lab 05/20/19 1920 05/27/2019 0430 05/22/19 0428 05/26/2019 0500  WBC 10.5 11.1* 10.6* 7.3  HGB 14.5 15.9* 15.2* 13.5  HCT 41.6 48.3* 46.5* 42.0  MCV 94.5 99.4 100.0 101.2*  PLT 191 194 169 114*   Cardiac Enzymes: No results for input(s): CKTOTAL, CKMB, CKMBINDEX, TROPONINI in the last 168 hours. Sepsis Labs: Recent Labs  Lab 05/20/19 1920 06/17/2019 0430 05/22/19 0428 05/22/19 1603 05/22/19 2239 06/17/2019 0500 06/05/2019 1612  WBC 10.5 11.1* 10.6*  --   --  7.3  --   LATICACIDVEN  --   --   --  7.0* 6.8*  7.2* 7.6*    Procedures/Operations  1/4 - Left IJ CVC       Darel Hong, AGACNP-BC [facility] Pager: [phone]  Bradly Bienenstock 05/22/2019, 10:07 PM

## 2019-06-19 NOTE — Progress Notes (Signed)
Family At bedside, clinical status relayed to family  Updated and notified of patients medical condition-  Progressive multiorgan failure with very low chance of meaningful recovery.  Patient is in dying  Process.  Family understands the situation.  They have consented and agreed to DNR/DNI and would like to proceed with Comfort care measures.   Family are satisfied with Plan of action and management. All questions answered  Shyia Fillingim David Nakeesha Bowler, M.D.  [facility]  Medical Director ICU-[facility] Medical Director [facility] Department     

## 2019-06-19 DEATH — deceased

## 2019-07-26 ENCOUNTER — Encounter: Payer: Medicare HMO | Admitting: Obstetrics and Gynecology

## 2021-05-13 IMAGING — CT CT ABD-PELV W/ CM
2 of 5 series · 15 of 46 positions shown, 17 images · IV contrast (APPLIED)
Comparison: CT 06/21/2013

CLINICAL DATA: Epigastric and upper abdominal pain for 2 hours.

EXAM:
CT ABDOMEN AND PELVIS WITH CONTRAST
TECHNIQUE: Multidetector CT imaging of the abdomen and pelvis was performed
using the standard protocol following bolus administration of
intravenous contrast.
CONTRAST:  100mL OMNIPAQUE IOHEXOL 300 MG/ML  SOLN

[Series 2: routine abd/pel with · axial · 0.84mm/px · z∈[-1157,-742]mm · 12 of 93 slices shown, 14 images]
[im 5/93  soft-tissue]
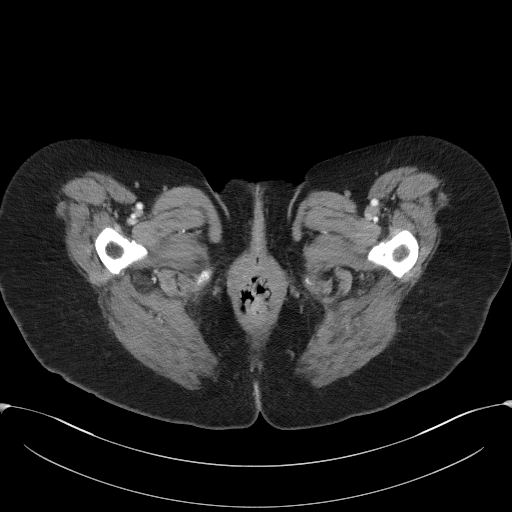
[im 5/93  bone]
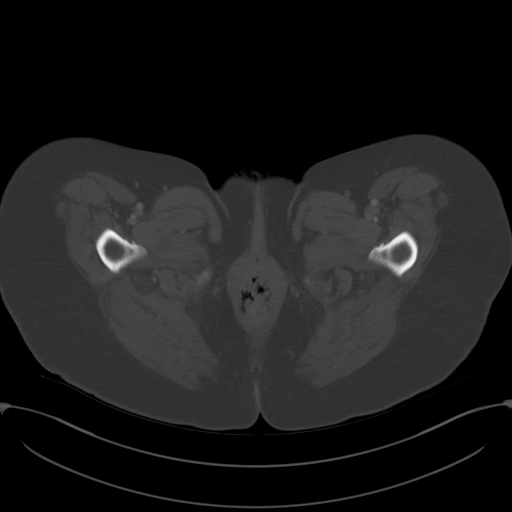
[im 15/93  soft-tissue]
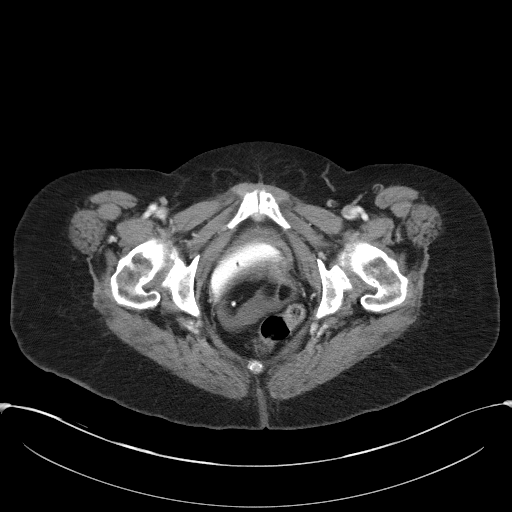
[im 20/93  soft-tissue]
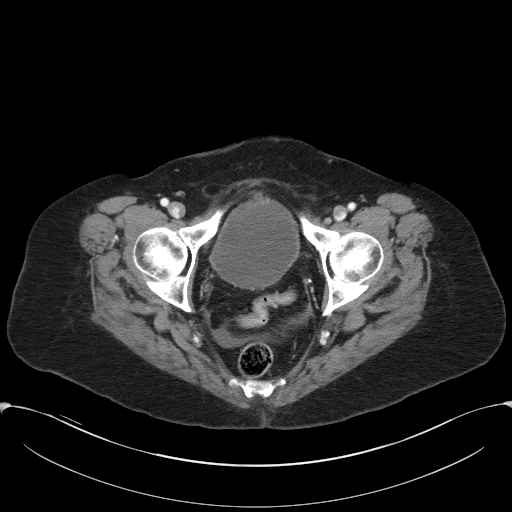
[im 30/93  soft-tissue]
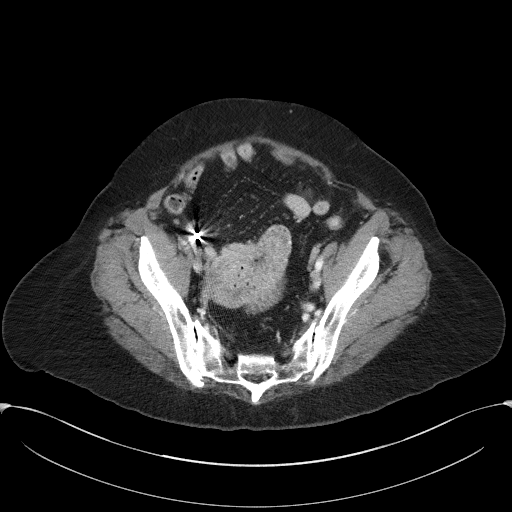
[im 34/93  soft-tissue]
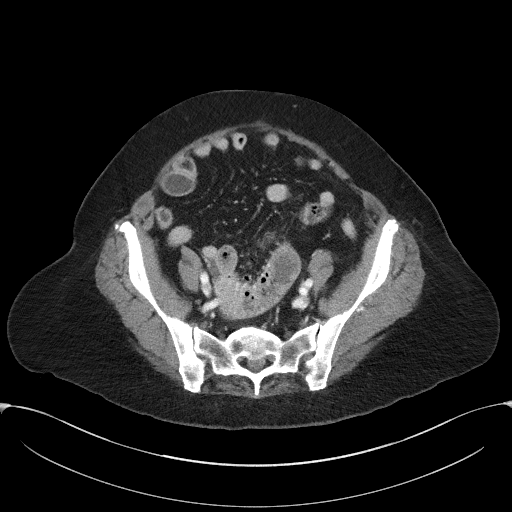
[im 44/93  soft-tissue]
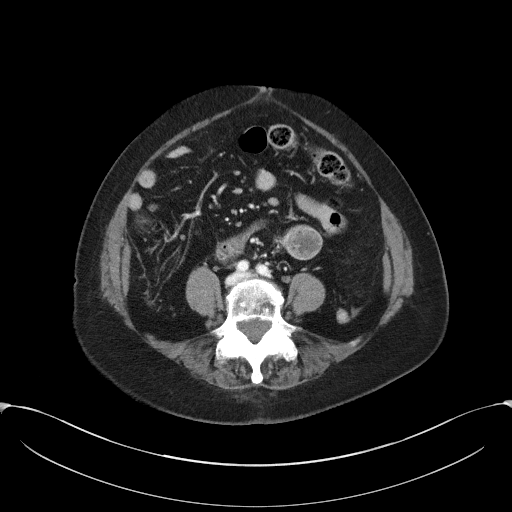
[im 49/93  soft-tissue]
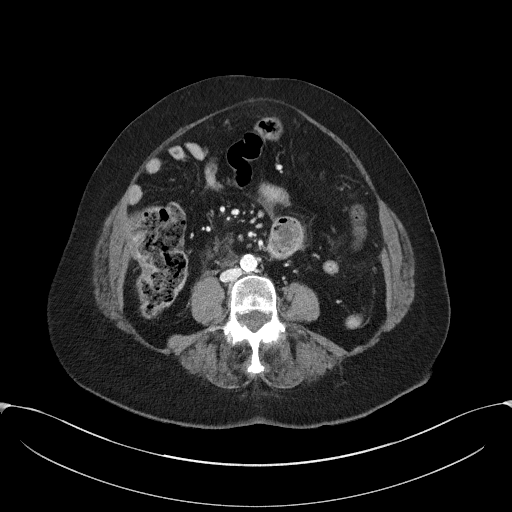
[im 59/93  soft-tissue]
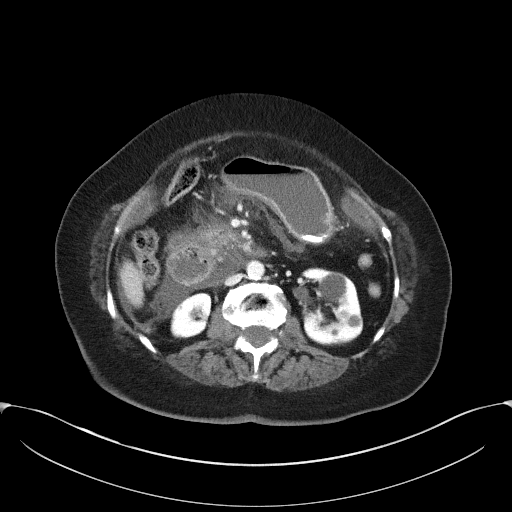
[im 63/93  soft-tissue]
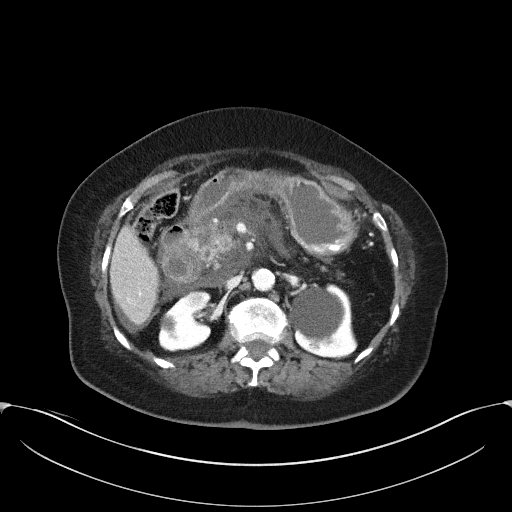
[im 63/93  bone]
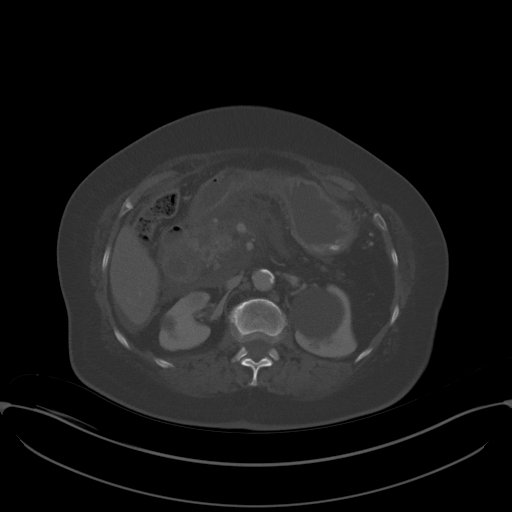
[im 73/93  soft-tissue]
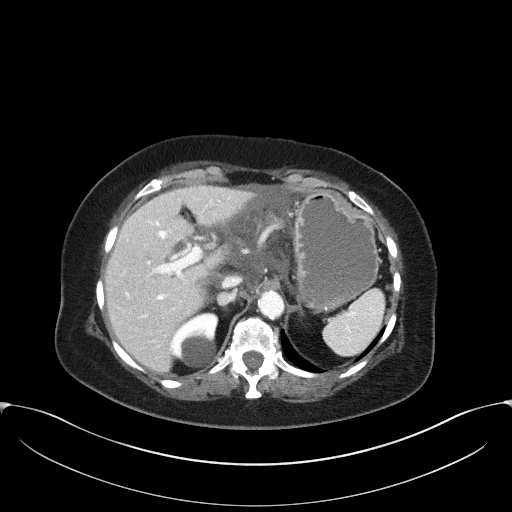
[im 78/93  soft-tissue]
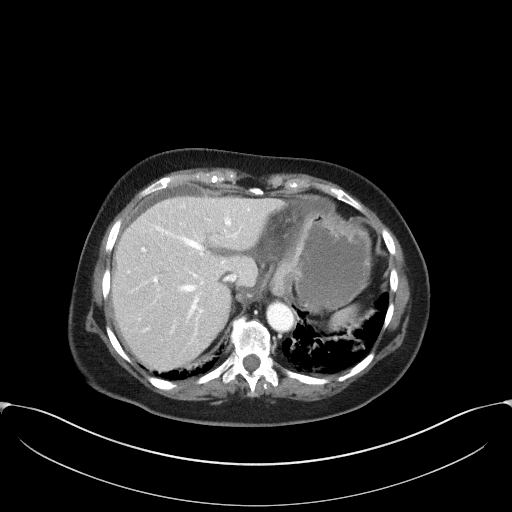
[im 88/93  soft-tissue]
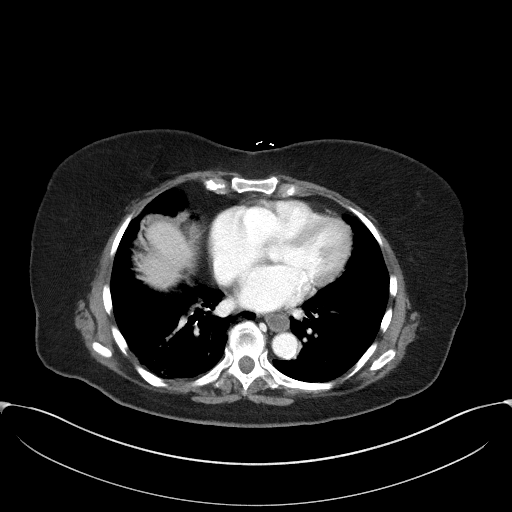

[Series 5: coronal st · coronal · 0.69mm/px · 3 of 96 slices shown]
[im 32/96  soft-tissue]
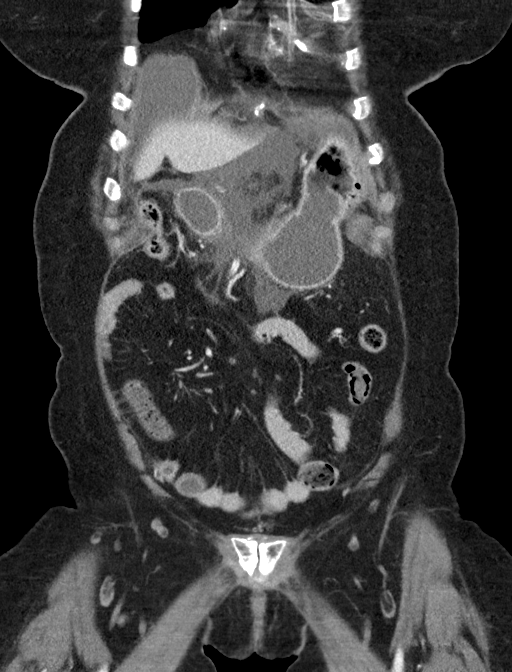
[im 43/96  soft-tissue]
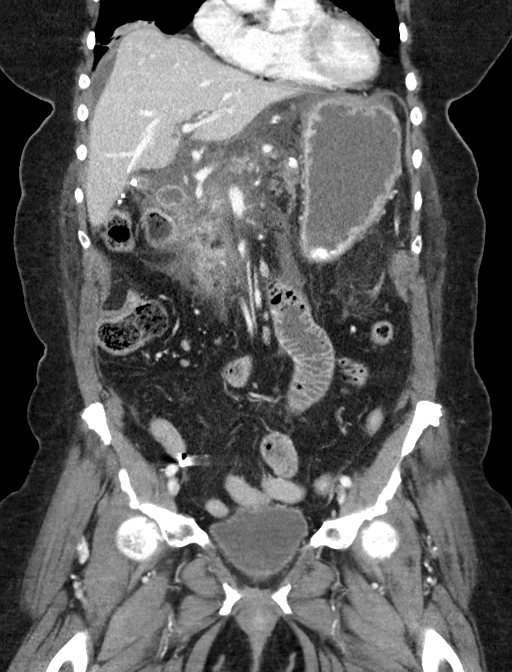
[im 53/96  soft-tissue]
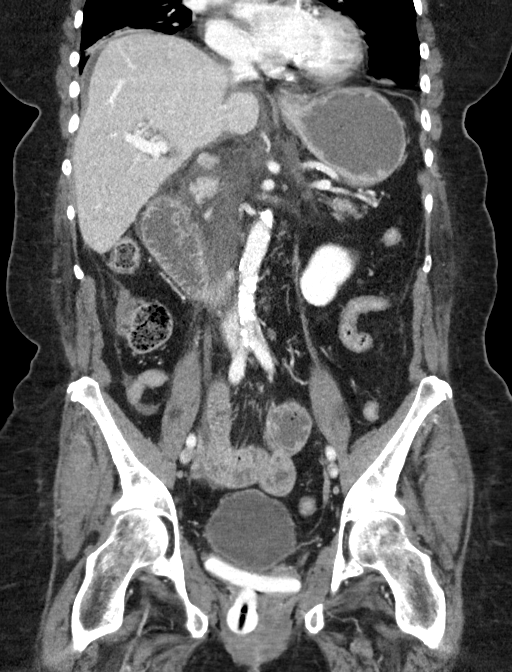

[15 of 46 positions shown; findings below may reference images not displayed]

FINDINGS: Lower chest: Bibasilar atelectasis, right greater than left.
Suspected bibasilar bronchiectasis. No pleural fluid.

Hepatobiliary: No focal hepatic abnormality. Postcholecystectomy. No
biliary dilatation. No visualized choledocholithiasis.

Pancreas: Moderate to severe peripancreatic fat stranding and free
fluid about the uncinate process, head, and proximal body of the
pancreas. Moderate non organized free fluid in the upper abdomen. No
evidence of pancreatic necrosis. No organized fluid collection. No
ductal dilatation. No visualized pancreatic mass.

Spleen: Normal in size without focal abnormality.

Adrenals/Urinary Tract: Normal adrenal glands. Multiple bilateral
renal cysts. No hydronephrosis. No evidence of solid renal lesion.
Homogeneous renal enhancement with symmetric excretion on delayed
phase imaging. Urinary bladder is physiologically distended. No
bladder wall thickening.

Stomach/Bowel: Distended distal esophagus with intraluminal fluid.
Fluid-filled distended stomach. No gastric wall thickening.
Prominent fluid-filled duodenum without duodenal wall thickening.
Proximal jejunum also fluid-filled. There is no small bowel wall
thickening or inflammation. No obstruction. Normal appendix. Small
volume of stool in the colon. No colonic wall thickening.

Vascular/Lymphatic: Prominent portal caval and peripancreatic nodes,
all subcentimeter. Portal vein is patent. Mesenteric vessels are
patent. Aortic atherosclerosis without aneurysm.

Reproductive: Pessary in place. Hysterectomy. No adnexal mass.

Other: Moderate volume of free fluid in the upper abdomen about the
pancreas, duodenum, and tracking in the right upper quadrant. Small
amount of free fluid in the right pericolic gutter and pelvis. No
free air. No organized collection.

Musculoskeletal: Multilevel degenerative change in the spine. There
are no acute or suspicious osseous abnormalities.
IMPRESSION: 1. Acute pancreatitis with moderate to severe peripancreatic fat
stranding and free fluid. No evidence of pancreatic necrosis or
organized fluid collection.
2. Fluid-filled distended distal esophagus, stomach and proximal
small bowel, likely secondary to pancreatitis.

Aortic Atherosclerosis ([id number]).
